# Patient Record
Sex: Female | Born: 1950 | Race: White | Hispanic: No | Marital: Married | Smoking: Never smoker
Health system: Southern US, Community
[De-identification: ages and names within clinical notes are randomized; demographics above are authoritative.]

## PROBLEM LIST (undated history)

## (undated) DIAGNOSIS — S99912A Unspecified injury of left ankle, initial encounter: Secondary | ICD-10-CM

## (undated) DIAGNOSIS — C801 Malignant (primary) neoplasm, unspecified: Secondary | ICD-10-CM

## (undated) DIAGNOSIS — M1990 Unspecified osteoarthritis, unspecified site: Secondary | ICD-10-CM

## (undated) DIAGNOSIS — K759 Inflammatory liver disease, unspecified: Secondary | ICD-10-CM

## (undated) DIAGNOSIS — F419 Anxiety disorder, unspecified: Secondary | ICD-10-CM

## (undated) DIAGNOSIS — Z8489 Family history of other specified conditions: Secondary | ICD-10-CM

## (undated) DIAGNOSIS — E785 Hyperlipidemia, unspecified: Secondary | ICD-10-CM

## (undated) HISTORY — PX: GANGLION CYST EXCISION: SHX1691

## (undated) HISTORY — DX: Hyperlipidemia, unspecified: E78.5

## (undated) HISTORY — PX: COLONOSCOPY: SHX174

## (undated) HISTORY — PX: REPLACEMENT TOTAL KNEE: SUR1224

## (undated) HISTORY — DX: Unspecified osteoarthritis, unspecified site: M19.90

## (undated) HISTORY — DX: Unspecified injury of left ankle, initial encounter: S99.912A

## (undated) HISTORY — PX: APPENDECTOMY: SHX54

## (undated) HISTORY — DX: Malignant (primary) neoplasm, unspecified: C80.1

## (undated) HISTORY — DX: Anxiety disorder, unspecified: F41.9

---

## 1998-08-31 ENCOUNTER — Other Ambulatory Visit: Admission: RE | Admit: 1998-08-31 | Discharge: 1998-08-31 | Payer: Self-pay | Admitting: Obstetrics and Gynecology

## 1999-07-25 HISTORY — PX: KNEE SURGERY: SHX244

## 1999-09-29 ENCOUNTER — Other Ambulatory Visit: Admission: RE | Admit: 1999-09-29 | Discharge: 1999-09-29 | Payer: Self-pay | Admitting: *Deleted

## 2000-07-03 ENCOUNTER — Encounter: Payer: Self-pay | Admitting: Obstetrics and Gynecology

## 2000-07-03 ENCOUNTER — Encounter: Admission: RE | Admit: 2000-07-03 | Discharge: 2000-07-03 | Payer: Self-pay | Admitting: Obstetrics and Gynecology

## 2000-10-25 ENCOUNTER — Other Ambulatory Visit: Admission: RE | Admit: 2000-10-25 | Discharge: 2000-10-25 | Payer: Self-pay | Admitting: Obstetrics and Gynecology

## 2001-10-03 ENCOUNTER — Encounter: Payer: Self-pay | Admitting: Obstetrics and Gynecology

## 2001-10-03 ENCOUNTER — Encounter: Admission: RE | Admit: 2001-10-03 | Discharge: 2001-10-03 | Payer: Self-pay | Admitting: Obstetrics and Gynecology

## 2001-11-15 ENCOUNTER — Other Ambulatory Visit: Admission: RE | Admit: 2001-11-15 | Discharge: 2001-11-15 | Payer: Self-pay | Admitting: Obstetrics and Gynecology

## 2002-11-21 ENCOUNTER — Other Ambulatory Visit: Admission: RE | Admit: 2002-11-21 | Discharge: 2002-11-21 | Payer: Self-pay | Admitting: Obstetrics and Gynecology

## 2002-11-26 ENCOUNTER — Encounter: Payer: Self-pay | Admitting: Obstetrics and Gynecology

## 2002-11-26 ENCOUNTER — Ambulatory Visit (HOSPITAL_COMMUNITY): Admission: RE | Admit: 2002-11-26 | Discharge: 2002-11-26 | Payer: Self-pay | Admitting: Obstetrics and Gynecology

## 2004-01-27 ENCOUNTER — Ambulatory Visit (HOSPITAL_COMMUNITY): Admission: RE | Admit: 2004-01-27 | Discharge: 2004-01-27 | Payer: Self-pay | Admitting: Obstetrics and Gynecology

## 2005-03-20 ENCOUNTER — Encounter: Admission: RE | Admit: 2005-03-20 | Discharge: 2005-03-20 | Payer: Self-pay | Admitting: Obstetrics and Gynecology

## 2005-03-23 ENCOUNTER — Other Ambulatory Visit: Admission: RE | Admit: 2005-03-23 | Discharge: 2005-03-23 | Payer: Self-pay | Admitting: Obstetrics and Gynecology

## 2006-02-23 ENCOUNTER — Ambulatory Visit: Payer: Self-pay | Admitting: Internal Medicine

## 2006-03-09 ENCOUNTER — Ambulatory Visit: Payer: Self-pay | Admitting: Internal Medicine

## 2006-03-30 ENCOUNTER — Other Ambulatory Visit: Admission: RE | Admit: 2006-03-30 | Discharge: 2006-03-30 | Payer: Self-pay | Admitting: Obstetrics and Gynecology

## 2006-04-17 ENCOUNTER — Encounter: Admission: RE | Admit: 2006-04-17 | Discharge: 2006-04-17 | Payer: Self-pay | Admitting: Obstetrics and Gynecology

## 2007-05-07 ENCOUNTER — Ambulatory Visit (HOSPITAL_COMMUNITY): Admission: RE | Admit: 2007-05-07 | Discharge: 2007-05-07 | Payer: Self-pay | Admitting: Obstetrics and Gynecology

## 2008-08-14 ENCOUNTER — Ambulatory Visit (HOSPITAL_COMMUNITY): Admission: RE | Admit: 2008-08-14 | Discharge: 2008-08-14 | Payer: Self-pay | Admitting: Obstetrics and Gynecology

## 2008-10-02 ENCOUNTER — Encounter: Admission: RE | Admit: 2008-10-02 | Discharge: 2008-10-02 | Payer: Self-pay

## 2009-09-22 ENCOUNTER — Ambulatory Visit (HOSPITAL_COMMUNITY): Admission: RE | Admit: 2009-09-22 | Discharge: 2009-09-22 | Payer: Self-pay | Admitting: Obstetrics and Gynecology

## 2010-08-14 ENCOUNTER — Encounter: Payer: Self-pay | Admitting: Obstetrics and Gynecology

## 2010-08-25 ENCOUNTER — Other Ambulatory Visit: Payer: Self-pay | Admitting: Obstetrics and Gynecology

## 2010-08-25 DIAGNOSIS — Z1239 Encounter for other screening for malignant neoplasm of breast: Secondary | ICD-10-CM

## 2010-09-26 ENCOUNTER — Ambulatory Visit
Admission: RE | Admit: 2010-09-26 | Discharge: 2010-09-26 | Disposition: A | Discharge status: Home / Self Care | Payer: BC Managed Care – PPO | Source: Ambulatory Visit | Attending: Obstetrics and Gynecology | Admitting: Obstetrics and Gynecology

## 2010-09-26 DIAGNOSIS — Z1239 Encounter for other screening for malignant neoplasm of breast: Secondary | ICD-10-CM

## 2011-01-22 LAB — HM PAP SMEAR: HM PAP: NORMAL

## 2011-04-03 ENCOUNTER — Encounter: Payer: Self-pay | Admitting: Internal Medicine

## 2011-09-20 ENCOUNTER — Encounter: Payer: Self-pay | Admitting: Internal Medicine

## 2011-09-20 ENCOUNTER — Other Ambulatory Visit: Payer: Self-pay | Admitting: Obstetrics and Gynecology

## 2011-09-20 DIAGNOSIS — Z1231 Encounter for screening mammogram for malignant neoplasm of breast: Secondary | ICD-10-CM

## 2011-10-13 ENCOUNTER — Ambulatory Visit (HOSPITAL_COMMUNITY)
Admission: RE | Admit: 2011-10-13 | Discharge: 2011-10-13 | Disposition: A | Discharge status: Home / Self Care | Payer: BC Managed Care – PPO | Source: Ambulatory Visit | Attending: Obstetrics and Gynecology | Admitting: Obstetrics and Gynecology

## 2011-10-13 DIAGNOSIS — Z1231 Encounter for screening mammogram for malignant neoplasm of breast: Secondary | ICD-10-CM | POA: Insufficient documentation

## 2011-10-19 ENCOUNTER — Encounter: Payer: Self-pay | Admitting: Internal Medicine

## 2011-10-19 ENCOUNTER — Ambulatory Visit (AMBULATORY_SURGERY_CENTER): Payer: BC Managed Care – PPO | Admitting: *Deleted

## 2011-10-19 VITALS — Ht 63.0 in | Wt 225.3 lb

## 2011-10-19 DIAGNOSIS — Z1211 Encounter for screening for malignant neoplasm of colon: Secondary | ICD-10-CM

## 2011-10-19 MED ORDER — PEG-KCL-NACL-NASULF-NA ASC-C 100 G PO SOLR: ORAL | Status: DC

## 2011-11-03 ENCOUNTER — Encounter: Payer: Self-pay | Admitting: Internal Medicine

## 2011-11-03 ENCOUNTER — Ambulatory Visit (AMBULATORY_SURGERY_CENTER): Payer: BC Managed Care – PPO | Admitting: Internal Medicine

## 2011-11-03 VITALS — BP 156/85 | HR 76 | Temp 98.2°F | Resp 17 | Ht 63.0 in | Wt 225.0 lb

## 2011-11-03 DIAGNOSIS — Z1211 Encounter for screening for malignant neoplasm of colon: Secondary | ICD-10-CM

## 2011-11-03 DIAGNOSIS — Z800 Family history of malignant neoplasm of digestive organs: Secondary | ICD-10-CM

## 2011-11-03 MED ORDER — SODIUM CHLORIDE 0.9 % IV SOLN: 500.0000 mL | INTRAVENOUS | Status: DC

## 2011-11-03 NOTE — Patient Instructions (Signed)

## 2011-11-03 NOTE — Op Note (Signed)
[facility] [address] [place], [place]  [address]  COLONOSCOPY PROCEDURE REPORT  PATIENT:  Deanna, Allen  MR#:  [id number] BIRTHDATE:  1951/05/10, 60 yrs. old  GENDER:  female ENDOSCOPIST:  Hedwig Morton. Juanda Chance, MD REF. BY:  Silverio Lay, M.D. PROCEDURE DATE:  11/03/2011 PROCEDURE:  Colonoscopy 96295 ASA CLASS:  Class I INDICATIONS:  family history of colon cancer parent and grandparent with colon cancer, prior exam 2002,2007 MEDICATIONS:   MAC sedation, administered by CRNA, propofol (Diprivan) 300 mg  DESCRIPTION OF PROCEDURE:   After the risks and benefits and of the procedure were explained, informed consent was obtained. Digital rectal exam was performed and revealed no rectal masses. The LB [id number] endoscope was introduced through the anus and advanced to the cecum, which was identified by both the appendix and ileocecal valve.  The quality of the prep was good, using MoviPrep.  The instrument was then slowly withdrawn as the colon was fully examined. <<PROCEDUREIMAGES>>  FINDINGS:  No polyps or cancers were seen (see image1, image2, and image3).  Internal Hemorrhoids were found (see image5 and image4). Retroflexed views in the rectum revealed no abnormalities.    The scope was then withdrawn from the patient and the procedure completed.  COMPLICATIONS:  None ENDOSCOPIC IMPRESSION: 1) No polyps or cancers 2) Internal hemorrhoids 3) Normal colonoscopy RECOMMENDATIONS: 1) High fiber diet.  REPEAT EXAM:  In 5 year(s) for.  ______________________________ Hedwig Morton. Juanda Chance, MD  CC:  n. eSIGNED:   Hedwig Morton. Dempsey Ahonen at 11/03/2011 11:05 AM  Mylinda Latina, [phone]

## 2011-11-03 NOTE — Progress Notes (Signed)
Patient did not have preoperative order for IV antibiotic SSI prophylaxis. (G8918)  Patient did not experience any of the following events: a burn prior to discharge; a fall within the facility; wrong site/side/patient/procedure/implant event; or a hospital transfer or hospital admission upon discharge from the facility. (G8907)  

## 2011-11-06 ENCOUNTER — Telehealth: Payer: Self-pay | Admitting: *Deleted

## 2011-11-06 NOTE — Telephone Encounter (Signed)
  Follow up Call-  Call back number 11/03/2011  Post procedure Call Back phone  # [phone]  Permission to leave phone message Yes    Left message that we called to follow up after procedure done on fri.

## 2012-04-16 ENCOUNTER — Other Ambulatory Visit: Payer: Self-pay | Admitting: Family Medicine

## 2012-04-16 NOTE — Telephone Encounter (Signed)
Need paper chart  

## 2012-04-25 ENCOUNTER — Encounter: Payer: Self-pay | Admitting: Family Medicine

## 2012-04-25 ENCOUNTER — Ambulatory Visit (INDEPENDENT_AMBULATORY_CARE_PROVIDER_SITE_OTHER): Payer: BC Managed Care – PPO | Admitting: Family Medicine

## 2012-04-25 VITALS — BP 156/89 | HR 86 | Temp 98.5°F | Resp 16 | Ht 62.0 in | Wt 227.8 lb

## 2012-04-25 DIAGNOSIS — G4700 Insomnia, unspecified: Secondary | ICD-10-CM

## 2012-04-25 DIAGNOSIS — H6120 Impacted cerumen, unspecified ear: Secondary | ICD-10-CM

## 2012-04-25 DIAGNOSIS — F419 Anxiety disorder, unspecified: Secondary | ICD-10-CM | POA: Insufficient documentation

## 2012-04-25 DIAGNOSIS — R030 Elevated blood-pressure reading, without diagnosis of hypertension: Secondary | ICD-10-CM

## 2012-04-25 DIAGNOSIS — E785 Hyperlipidemia, unspecified: Secondary | ICD-10-CM

## 2012-04-25 DIAGNOSIS — Z23 Encounter for immunization: Secondary | ICD-10-CM

## 2012-04-25 DIAGNOSIS — Z0000 Encounter for general adult medical examination without abnormal findings: Secondary | ICD-10-CM

## 2012-04-25 LAB — POCT URINALYSIS DIPSTICK
Glucose, UA: NEGATIVE
Nitrite, UA: NEGATIVE
Protein, UA: NEGATIVE
Spec Grav, UA: 1.025
Urobilinogen, UA: 0.2

## 2012-04-25 MED ORDER — PRAVASTATIN SODIUM 20 MG PO TABS: 20.0000 mg | ORAL_TABLET | Freq: Every day | ORAL | Status: DC

## 2012-04-25 MED ORDER — LORAZEPAM 1 MG PO TABS: 1.0000 mg | ORAL_TABLET | Freq: Every day | ORAL | Status: DC

## 2012-04-25 NOTE — Progress Notes (Signed)
Subjective:    Patient ID: Deanna Allen, female    DOB: January 10, 1951, 61 y.o.   MRN: [id number]  HPI  This 61 y.o. Cauc female is here for CPE/PAP; she had normal PAP in June 2012 but is  concerned because one of her good friends was recently diagnosed with cervical cancer after having   "normal" PAPs. After further discussion, pt decides not to have PAP today.   Chronic issues with Insomnia- virtually no sleep while rasing son because he suffered with  "night terrors" and would not sleep more than a few hours; now she cannot sleep through the night  without Lorazepam.    Last  MMG: March 2013 (normal)  Last Colonoscopy; July 2013 (normal)    Review of Systems  Constitutional: Negative.   HENT: Positive for sneezing.        Chronic problem with excessive ear wax- needs ears flushed out  Eyes: Positive for redness and itching.  Respiratory: Negative.   Cardiovascular: Negative.   Gastrointestinal: Negative.   Musculoskeletal: Positive for arthralgias.  Skin: Negative.   Neurological: Negative.   Hematological: Negative.   Psychiatric/Behavioral: Positive for disturbed wake/sleep cycle. The patient is nervous/anxious.        Hx of panic attacks x 17 years; finally off Zoloft in 2012       Objective:   Physical Exam  Nursing note and vitals reviewed. Constitutional: She is oriented to person, place, and time. She appears well-developed and well-nourished. No distress.  HENT:  Head: Normocephalic and atraumatic.  Right Ear: Hearing and external ear normal.  Left Ear: Hearing and external ear normal.  Nose: Nose normal. No mucosal edema, nasal deformity or septal deviation.  Mouth/Throat: Uvula is midline, oropharynx is clear and moist and mucous membranes are normal. No oral lesions. Normal dentition. No dental caries.       EACs occluded by cerumen  Eyes: Conjunctivae normal, EOM and lids are normal. Pupils are equal, round, and reactive to light. No scleral icterus.    Fundoscopic exam:      The right eye shows arteriolar narrowing. The right eye shows no exudate and no hemorrhage. The right eye shows red reflex.      The left eye shows arteriolar narrowing. The left eye shows no exudate and no hemorrhage. The left eye shows red reflex. Neck: Normal range of motion. Neck supple. No JVD present. No thyromegaly present.  Cardiovascular: Normal rate, regular rhythm, normal heart sounds and intact distal pulses.  Exam reveals no gallop and no friction rub.   No murmur heard. Pulmonary/Chest: Effort normal and breath sounds normal. No respiratory distress. She has no wheezes. She exhibits no tenderness. Right breast exhibits no inverted nipple, no mass, no nipple discharge, no skin change and no tenderness. Left breast exhibits no inverted nipple, no mass, no nipple discharge, no skin change and no tenderness. Breasts are symmetrical.  Abdominal: Soft. Bowel sounds are normal. She exhibits no distension, no abdominal bruit, no pulsatile midline mass and no mass. There is no hepatosplenomegaly. There is no tenderness. There is no guarding and no CVA tenderness.  Genitourinary:       GYN exam deferred  Musculoskeletal: Normal range of motion. She exhibits no edema and no tenderness.  Lymphadenopathy:    She has no cervical adenopathy.  Neurological: She is alert and oriented to person, place, and time. She has normal reflexes. No cranial nerve deficit. She exhibits normal muscle tone. Coordination normal.  Skin: Skin is warm and dry.  No erythema. No pallor.  Psychiatric: She has a normal mood and affect. Her behavior is normal. Judgment and thought content normal.    ECG: NRS, no ectopy or ST-TW changes      Assessment & Plan:   1. Routine general medical examination at a health care facility  POCT urinalysis dipstick, EKG 12-Lead  2. Insomnia  RF: Lorazepam  3. Hyperlipidemia  Comprehensive metabolic panel, Lipid panel  4. Cerumen impaction  Bilateral ear  irrigation completed  5. Need for influenza vaccination  Flu vaccine greater than or equal to 3yo preservative free IM  6. Obesity, Class III, BMI 40-49.9 (morbid obesity)  Encouraged better nutrition and physical activity  7. Elevated blood pressure reading  Advised monitoring; expect reduction with weight loss

## 2012-04-25 NOTE — Patient Instructions (Addendum)
Keeping You Healthy  Get These Tests  Blood Pressure- Have your blood pressure checked by your healthcare provider at least once a year.  Normal blood pressure is 120/80.  Weight- Have your body mass index (BMI) calculated to screen for obesity.  BMI is a measure of body fat based on height and weight.  You can calculate your own BMI at https://www.west-esparza.com/  Cholesterol- Have your cholesterol checked every year.  Diabetes- Have your blood sugar checked every year if you have high blood pressure, high cholesterol, a family history of diabetes or if you are overweight.  Pap Smear- Have a pap smear every 1 to 3 years if you have been sexually active.  If you are older than 65 and recent pap smears have been normal you may not need additional pap smears.  In addition, if you have had a hysterectomy  For benign disease additional pap smears are not necessary.  Mammogram-Yearly mammograms are essential for early detection of breast cancer  Screening for Colon Cancer- Colonoscopy starting at age 26. Screening may begin sooner depending on your family history and other health conditions.  Follow up colonoscopy as directed by your Gastroenterologist.  Screening for Osteoporosis- Screening begins at age 61 with bone density scanning, sooner if you are at higher risk for developing Osteoporosis.  Get these medicines  Calcium with Vitamin D- Your body requires 1200-1500 mg of Calcium a day and [phone] IU of Vitamin D a day.  You can only absorb 500 mg of Calcium at a time therefore Calcium must be taken in 2 or 3 separate doses throughout the day.  Hormones- Hormone therapy has been associated with increased risk for certain cancers and heart disease.  Talk to your healthcare provider about if you need relief from menopausal symptoms.  Aspirin- Ask your healthcare provider about taking Aspirin to prevent Heart Disease and Stroke.  Get these Immuniztions  Flu shot- Every fall. You received  your Flu vaccine today.  Pneumonia shot- Once after the age of 80; if you are younger ask your healthcare provider if you need a pneumonia shot.  Tetanus- Every ten years. Your last Tetanus (Tdap) was given in 2012. Next dose due 201022; this will be a regular Tetanus.  Zostavax- Once after the age of 53 to prevent shingles. You have been given a prescription for this vaccine; it is available at [employer] and [employer] at [facility].  Take these steps  Don't smoke- Your healthcare provider can help you quit. For tips on how to quit, ask your healthcare provider or go to www.smokefree.gov or call 1-800 QUIT-NOW.  Be physically active- Exercise 5 days a week for a minimum of 30 minutes.  If you are not already physically active, start slow and gradually work up to 30 minutes of moderate physical activity.  Try walking, dancing, bike riding, swimming, etc.  Eat a healthy diet- Eat a variety of healthy foods such as fruits, vegetables, whole grains, low fat milk, low fat cheeses, yogurt, lean meats, chicken, fish, eggs, dried beans, tofu, etc.  For more information go to www.thenutritionsource.org  Dental visit- Brush and floss teeth twice daily; visit your dentist twice a year.  Eye exam- Visit your Optometrist or Ophthalmologist yearly.  Drink alcohol in moderation- Limit alcohol intake to one drink or less a day.  Never drink and drive.  Depression- Your emotional health is as important as your physical health.  If you're feeling down or losing interest in things you normally enjoy,  please talk to your healthcare provider.  Seat Belts- can save your life; always wear one  Smoke/Carbon Monoxide detectors- These detectors need to be installed on the appropriate level of your home.  Replace batteries at least once a year.  Violence- If anyone is threatening or hurting you, please tell your healthcare provider.  Living Will/ Health care power of attorney- Discuss with your  healthcare provider and family.    Hypertriglyceridemia  Diet for High blood levels of Triglycerides Most fats in food are triglycerides. Triglycerides in your blood are stored as fat in your body. High levels of triglycerides in your blood may put you at a greater risk for heart disease and stroke.  Normal triglyceride levels are less than 150 mg/dL. Borderline high levels are 150-199 mg/dl. High levels are 200 - 499 mg/dL, and very high triglyceride levels are greater than 500 mg/dL. The decision to treat high triglycerides is generally based on the level. For people with borderline or high triglyceride levels, treatment includes weight loss and exercise. Drugs are recommended for people with very high triglyceride levels. Many people who need treatment for high triglyceride levels have metabolic syndrome. This syndrome is a collection of disorders that often include: insulin resistance, high blood pressure, blood clotting problems, high cholesterol and triglycerides. TESTING PROCEDURE FOR TRIGLYCERIDES  You should not eat 4 hours before getting your triglycerides measured. The normal range of triglycerides is between 10 and 250 milligrams per deciliter (mg/dl). Some people may have extreme levels (1000 or above), but your triglyceride level may be too high if it is above 150 mg/dl, depending on what other risk factors you have for heart disease.  People with high blood triglycerides may also have high blood cholesterol levels. If you have high blood cholesterol as well as high blood triglycerides, your risk for heart disease is probably greater than if you only had high triglycerides. High blood cholesterol is one of the main risk factors for heart disease. CHANGING YOUR DIET  Your weight can affect your blood triglyceride level. If you are more than 20% above your ideal body weight, you may be able to lower your blood triglycerides by losing weight. Eating less and exercising regularly is the best  way to combat this. Fat provides more calories than any other food. The best way to lose weight is to eat less fat. Only 30% of your total calories should come from fat. Less than 7% of your diet should come from saturated fat. A diet low in fat and saturated fat is the same as a diet to decrease blood cholesterol. By eating a diet lower in fat, you may lose weight, lower your blood cholesterol, and lower your blood triglyceride level.  Eating a diet low in fat, especially saturated fat, may also help you lower your blood triglyceride level. Ask your dietitian to help you figure how much fat you can eat based on the number of calories your caregiver has prescribed for you.  Exercise, in addition to helping with weight loss may also help lower triglyceride levels.   Alcohol can increase blood triglycerides. You may need to stop drinking alcoholic beverages.  Too much carbohydrate in your diet may also increase your blood triglycerides. Some complex carbohydrates are necessary in your diet. These may include bread, rice, potatoes, other starchy vegetables and cereals.  Reduce "simple" carbohydrates. These may include pure sugars, candy, honey, and jelly without losing other nutrients. If you have the kind of high blood triglycerides that is affected  by the amount of carbohydrates in your diet, you will need to eat less sugar and less high-sugar foods. Your caregiver can help you with this.  Adding 2-4 grams of fish oil (EPA+ DHA) may also help lower triglycerides. Speak with your caregiver before adding any supplements to your regimen. Following the Diet  Maintain your ideal weight. Your caregivers can help you with a diet. Generally, eating less food and getting more exercise will help you lose weight. Joining a weight control group may also help. Ask your caregivers for a good weight control group in your area.  Eat low-fat foods instead of high-fat foods. This can help you lose weight too.  These  foods are lower in fat. Eat MORE of these:   Dried beans, peas, and lentils.  Egg whites.  Low-fat cottage cheese.  Fish.  Lean cuts of meat, such as round, sirloin, rump, and flank (cut extra fat off meat you fix).  Whole grain breads, cereals and pasta.  Skim and nonfat dry milk.  Low-fat yogurt.  Poultry without the skin.  Cheese made with skim or part-skim milk, such as mozzarella, parmesan, farmers', ricotta, or pot cheese. These are higher fat foods. Eat LESS of these:   Whole milk and foods made from whole milk, such as American, blue, cheddar, monterey jack, and swiss cheese  High-fat meats, such as luncheon meats, sausages, knockwurst, bratwurst, hot dogs, ribs, corned beef, ground pork, and regular ground beef.  Fried foods. Limit saturated fats in your diet. Substituting unsaturated fat for saturated fat may decrease your blood triglyceride level. You will need to read package labels to know which products contain saturated fats.  These foods are high in saturated fat. Eat LESS of these:   Fried pork skins.  Whole milk.  Skin and fat from poultry.  Palm oil.  Butter.  Shortening.  Cream cheese.  Tomasa Blase.  Margarines and baked goods made from listed oils.  Vegetable shortenings.  Chitterlings.  Fat from meats.  Coconut oil.  Palm kernel oil.  Lard.  Cream.  Sour cream.  Fatback.  Coffee whiteners and non-dairy creamers made with these oils.  Cheese made from whole milk. Use unsaturated fats (both polyunsaturated and monounsaturated) moderately. Remember, even though unsaturated fats are better than saturated fats; you still want a diet low in total fat.  These foods are high in unsaturated fat:   Canola oil.  Sunflower oil.  Mayonnaise.  Almonds.  Peanuts.  Pine nuts.  Margarines made with these oils.  Safflower oil.  Olive oil.  Avocados.  Cashews.  Peanut butter.  Sunflower seeds.  Soybean oil.  Peanut  oil.  Olives.  Pecans.  Walnuts.  Pumpkin seeds. Avoid sugar and other high-sugar foods. This will decrease carbohydrates without decreasing other nutrients. Sugar in your food goes rapidly to your blood. When there is excess sugar in your blood, your liver may use it to make more triglycerides. Sugar also contains calories without other important nutrients.  Eat LESS of these:   Sugar, brown sugar, powdered sugar, jam, jelly, preserves, honey, syrup, molasses, pies, candy, cakes, cookies, frosting, pastries, colas, soft drinks, punches, fruit drinks, and regular gelatin.  Avoid alcohol. Alcohol, even more than sugar, may increase blood triglycerides. In addition, alcohol is high in calories and low in nutrients. Ask for sparkling water, or a diet soft drink instead of an alcoholic beverage. Suggestions for planning and preparing meals   Bake, broil, grill or roast meats instead of frying.  Remove fat  from meats and skin from poultry before cooking.  Add spices, herbs, lemon juice or vinegar to vegetables instead of salt, rich sauces or gravies.  Use a non-stick skillet without fat or use no-stick sprays.  Cool and refrigerate stews and broth. Then remove the hardened fat floating on the surface before serving.  Refrigerate meat drippings and skim off fat to make low-fat gravies.  Serve more fish.  Use less butter, margarine and other high-fat spreads on bread or vegetables.  Use skim or reconstituted non-fat dry milk for cooking.  Cook with low-fat cheeses.  Substitute low-fat yogurt or cottage cheese for all or part of the sour cream in recipes for sauces, dips or congealed salads.  Use half yogurt/half mayonnaise in salad recipes.  Substitute evaporated skim milk for cream. Evaporated skim milk or reconstituted non-fat dry milk can be whipped and substituted for whipped cream in certain recipes.  Choose fresh fruits for dessert instead of high-fat foods such as pies or  cakes. Fruits are naturally low in fat. When Dining Out   Order low-fat appetizers such as fruit or vegetable juice, pasta with vegetables or tomato sauce.  Select clear, rather than cream soups.  Ask that dressings and gravies be served on the side. Then use less of them.  Order foods that are baked, broiled, poached, steamed, stir-fried, or roasted.  Ask for margarine instead of butter, and use only a small amount.  Drink sparkling water, unsweetened tea or coffee, or diet soft drinks instead of alcohol or other sweet beverages. QUESTIONS AND ANSWERS ABOUT OTHER FATS IN THE BLOOD: SATURATED FAT, TRANS FAT, AND CHOLESTEROL What is trans fat? Trans fat is a type of fat that is formed when vegetable oil is hardened through a process called hydrogenation. This process helps makes foods more solid, gives them shape, and prolongs their shelf life. Trans fats are also called hydrogenated or partially hydrogenated oils.  What do saturated fat, trans fat, and cholesterol in foods have to do with heart disease? Saturated fat, trans fat, and cholesterol in the diet all raise the level of LDL "bad" cholesterol in the blood. The higher the LDL cholesterol, the greater the risk for coronary heart disease (CHD). Saturated fat and trans fat raise LDL similarly.  What foods contain saturated fat, trans fat, and cholesterol? High amounts of saturated fat are found in animal products, such as fatty cuts of meat, chicken skin, and full-fat dairy products like butter, whole milk, cream, and cheese, and in tropical vegetable oils such as palm, palm kernel, and coconut oil. Trans fat is found in some of the same foods as saturated fat, such as vegetable shortening, some margarines (especially hard or stick margarine), crackers, cookies, baked goods, fried foods, salad dressings, and other processed foods made with partially hydrogenated vegetable oils. Small amounts of trans fat also occur naturally in some animal  products, such as milk products, beef, and lamb. Foods high in cholesterol include liver, other organ meats, egg yolks, shrimp, and full-fat dairy products. How can I use the new food label to make heart-healthy food choices? Check the Nutrition Facts panel of the food label. Choose foods lower in saturated fat, trans fat, and cholesterol. For saturated fat and cholesterol, you can also use the Percent Daily Value (%DV): 5% DV or less is low, and 20% DV or more is high. (There is no %DV for trans fat.) Use the Nutrition Facts panel to choose foods low in saturated fat and cholesterol, and if the  trans fat is not listed, read the ingredients and limit products that list shortening or hydrogenated or partially hydrogenated vegetable oil, which tend to be high in trans fat. POINTS TO REMEMBER:   Discuss your risk for heart disease with your caregivers, and take steps to reduce risk factors.  Change your diet. Choose foods that are low in saturated fat, trans fat, and cholesterol.  Add exercise to your daily routine if it is not already being done. Participate in physical activity of moderate intensity, like brisk walking, for at least 30 minutes on most, and preferably all days of the week. No time? Break the 30 minutes into three, 10-minute segments during the day.  Stop smoking. If you do smoke, contact your caregiver to discuss ways in which they can help you quit.  Do not use street drugs.  Maintain a normal weight.  Maintain a healthy blood pressure.  Keep up with your blood work for checking the fats in your blood as directed by your caregiver. Document Released: 04/27/2004 Document Revised: 01/09/2012 Document Reviewed: 11/23/2008 [facility] Patient Information 2013 [place], [place].    Shingles Shingles is caused by the same virus that causes chickenpox (varicella zoster virus or VZV). Shingles often occurs many years or decades after having chickenpox. That is why it is more common in  adults older than 50 years. The virus reactivates and breaks out as an infection in a nerve root. SYMPTOMS   The initial feeling (sensations) may be pain. This pain is usually described as:  Burning.  Stabbing.  Throbbing.  Tingling in the nerve root.  A red rash will follow in a couple days. The rash may occur in any area of the body and is usually on one side (unilateral) of the body in a band or belt-like pattern. The rash usually starts out as very small blisters (vesicles). They will dry up after 7 to 10 days. This is not usually a significant problem except for the pain it causes.  Long-lasting (chronic) pain is more likely in an elderly person. It can last months to years. This condition is called postherpetic neuralgia. Shingles can be an extremely severe infection in someone with AIDS, a weakened immune system, or with forms of leukemia. It can also be severe if you are taking transplant medicines or other medicines that weaken the immune system. TREATMENT  Your caregiver will often treat you with:  Antiviral drugs.  Anti-inflammatory drugs.  Pain medicines. Bed rest is very important in preventing the pain associated with herpes zoster (postherpetic neuralgia). Application of heat in the form of a hot water bottle or electric heating pad or gentle pressure with the hand is recommended to help with the pain or discomfort. PREVENTION  A varicella zoster vaccine is available to help protect against the virus. The Food and Drug Administration approved the varicella zoster vaccine for individuals 70 years of age and older. HOME CARE INSTRUCTIONS   Cool compresses to the area of rash may be helpful.  Only take over-the-counter or prescription medicines for pain, discomfort, or fever as directed by your caregiver.  Avoid contact with:  Babies.  Pregnant women.  Children with eczema.  Elderly people with transplants.  People with chronic illnesses, such as leukemia and  AIDS.  If the area involved is on your face, you may receive a referral for follow-up to a specialist. It is very important to keep all follow-up appointments. This will help avoid eye complications, chronic pain, or disability. SEEK IMMEDIATE MEDICAL CARE  IF:   You develop any pain (headache) in the area of the face or eye. This must be followed carefully by your caregiver or ophthalmologist. An infection in part of your eye (cornea) can be very serious. It could lead to blindness.  You do not have pain relief from prescribed medicines.  Your redness or swelling spreads.  The area involved becomes very swollen and painful.  You have a fever.  You notice any red or painful lines extending away from the affected area toward your heart (lymphangitis).  Your condition is worsening or has changed. Document Released: 07/10/2005 Document Revised: 10/02/2011 Document Reviewed: 06/14/2009 [facility] Patient Information 2013 [place], [place].

## 2012-04-26 LAB — COMPREHENSIVE METABOLIC PANEL
AST: 22 U/L (ref 0–37)
Albumin: 4.3 g/dL (ref 3.5–5.2)
Alkaline Phosphatase: 108 U/L (ref 39–117)
BUN: 19 mg/dL (ref 6–23)
Glucose, Bld: 89 mg/dL (ref 70–99)
Potassium: 4.2 mEq/L (ref 3.5–5.3)
Sodium: 140 mEq/L (ref 135–145)
Total Bilirubin: 0.8 mg/dL (ref 0.3–1.2)
Total Protein: 7.2 g/dL (ref 6.0–8.3)

## 2012-04-26 LAB — LIPID PANEL
HDL: 46 mg/dL (ref >39)
LDL Cholesterol: 98 mg/dL (ref 0–99)
Triglycerides: 135 mg/dL (ref <150)
VLDL: 27 mg/dL (ref 0–40)

## 2012-04-26 NOTE — Progress Notes (Signed)
Quick Note:  Please notify pt that results are normal.   Provide pt with copy of labs. ______ 

## 2012-05-22 ENCOUNTER — Encounter: Payer: Self-pay | Admitting: Family Medicine

## 2012-09-13 ENCOUNTER — Ambulatory Visit (INDEPENDENT_AMBULATORY_CARE_PROVIDER_SITE_OTHER): Payer: PRIVATE HEALTH INSURANCE | Admitting: Family Medicine

## 2012-09-13 ENCOUNTER — Encounter: Payer: Self-pay | Admitting: Family Medicine

## 2012-09-13 VITALS — BP 152/86 | HR 97 | Temp 98.3°F | Resp 16 | Ht 62.0 in | Wt 214.0 lb

## 2012-09-13 DIAGNOSIS — F411 Generalized anxiety disorder: Secondary | ICD-10-CM

## 2012-09-13 DIAGNOSIS — G4700 Insomnia, unspecified: Secondary | ICD-10-CM

## 2012-09-13 DIAGNOSIS — F5104 Psychophysiologic insomnia: Secondary | ICD-10-CM

## 2012-09-13 DIAGNOSIS — L719 Rosacea, unspecified: Secondary | ICD-10-CM

## 2012-09-13 MED ORDER — SERTRALINE HCL 50 MG PO TABS: ORAL_TABLET | ORAL | Status: DC

## 2012-09-13 MED ORDER — METRONIDAZOLE 0.75 % EX GEL: Freq: Two times a day (BID) | CUTANEOUS | Status: DC

## 2012-09-13 MED ORDER — LORAZEPAM 2 MG PO TABS: ORAL_TABLET | ORAL | Status: DC

## 2012-09-13 NOTE — Patient Instructions (Addendum)
Rosacea skin care- cleanse your facial area with Cetaphil; it is a gentle cleanser for sensitive skin available at your pharmacy or grocery store.  For anxiety- try drinking an herbal tea; there are some good ones available at [employer]. Lemon balm is also good for anxiety and it may be available in capsule form or in a tea (combined with other calming herbs).

## 2012-09-13 NOTE — Progress Notes (Signed)
S:  This 62 y.o. Cauc female has chronic anxiety, treated for 16 years w/ Zoloft. Pt weaned herself off this medication in Nov 2012. She is having increasing anxiety for the last 3 months and senses that she needs to restart the medication. She states that she "just never has handled changes well" and anxiety dates back to her childhood. Her husband has been diagnosed w/ Parkinson's Disease; he is still functioning and working. She admits she has a "wonderful life" with successful children and "beautiful grandchildren" and "really has no reason to feel the way" she does. They have a vacation home at [facility], [place]. but she "absolutely hates it" because it is in an isolated area; her husband purchased the property as a Glass blower/designer.  She has been exercising at the fitness center and feels better. 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 She has been told she needs TKR but she is putting this off as long as possible.  Chronic insomnia dates back to when her son had night terrors for 11 years; Lorazepam 1 mg helps pt get 4 hours of sleep at the most.   Facial rash x 2 months started along jaw line but now has spread to both cheeks and forehead. Pt had read about rosacea which is what she thinks she has. She cleanses with a gentle soap and water. No new perfumes, skin produucts, OTC supplements or exposure to allergens. Pt denies fever, other rash, HA, myalgias or arthralgias, n/v or other GI disturbance.  ROS: As per HPI.  O: Filed Vitals:   09/13/12 1412  BP: 152/86  Pulse: 97  Temp: 98.3 F (36.8 C)  Resp: 16   GEN: In NAD; WN,WD. Pt is obese. HEENT: [place]/AT; EOMI w/ clear conj/ sclerae. EACs/ TMs normal. Oroph clear and moist. NECK: Supple w/o LAN or TMG. COR: RRR. LUNGS: Normal resp rate and effort. SKIN: W&D except Facial area- intensely erythematous papules/macules over cheeks and forehead. No pustules or vesicles. MS: Knees- bilat deformities c/w DJD. Decreased  ROM. NEURO: A&O x 3; CNs intact. Mentation- anxious, very talkative; attentive w/ normal behavior; good judgement and coherent thoughts.  A/P: Generalized anxiety disorder- RX: Sertraline 50 mg 1/2 tablet ever AM. Consider counseling.  Chronic insomnia- RX: Lorazepam 2 mg tablet  Take 1 tablet hs for ~2 weeks while initiating Sertraline then decrease dose to 1/2 tab hs.  Acne rosacea- RX: Metrogel topical. Gentle skin cleansing routine. Rash may improve if anxiety is controlled.   Meds ordered this encounter  Medications  . sertraline (ZOLOFT) 50 MG tablet    Sig: Take 1/2 tablet every AM for anxiety or as directed.    Dispense:  30 tablet    Refill:  5  . LORazepam (ATIVAN) 2 MG tablet    Sig: Take 1/2 - 1 tablet hs for sleep.    Dispense:  30 tablet    Refill:  2  . metroNIDAZOLE (METROGEL) 0.75 % gel    Sig: Apply topically 2 (two) times daily.    Dispense:  45 g    Refill:  3

## 2012-10-01 ENCOUNTER — Other Ambulatory Visit: Payer: Self-pay | Admitting: Radiology

## 2012-10-01 MED ORDER — SERTRALINE HCL 50 MG PO TABS: ORAL_TABLET | ORAL | Status: DC

## 2012-10-01 NOTE — Telephone Encounter (Signed)
Refill encounter ?

## 2012-10-07 ENCOUNTER — Telehealth: Payer: Self-pay

## 2012-10-07 MED ORDER — SERTRALINE HCL 50 MG PO TABS: ORAL_TABLET | ORAL | Status: DC

## 2012-10-07 NOTE — Telephone Encounter (Signed)
Pt reported that ins prefers she get her Rxs through [facility] now. She last got her sertraline at [facility] in Feb. I will send in Rx to [facility] and pt stated she will make sure that [facility] cancels the one they got ready for her last week that she did not p/up.

## 2012-10-07 NOTE — Telephone Encounter (Signed)
LMOM on both #s to CB. We received a req for RF on pt's sertraline 50 mg from [facility]. We just sent in 1 yr of RFs to local pharmacy. Where does pt want this Rx?

## 2012-10-14 ENCOUNTER — Ambulatory Visit: Payer: PRIVATE HEALTH INSURANCE

## 2012-10-14 ENCOUNTER — Ambulatory Visit (INDEPENDENT_AMBULATORY_CARE_PROVIDER_SITE_OTHER): Payer: PRIVATE HEALTH INSURANCE | Admitting: Emergency Medicine

## 2012-10-14 VITALS — BP 120/78 | HR 91 | Temp 98.2°F | Resp 16 | Ht 62.0 in | Wt 212.0 lb

## 2012-10-14 DIAGNOSIS — M25579 Pain in unspecified ankle and joints of unspecified foot: Secondary | ICD-10-CM

## 2012-10-14 DIAGNOSIS — S92302A Fracture of unspecified metatarsal bone(s), left foot, initial encounter for closed fracture: Secondary | ICD-10-CM

## 2012-10-14 DIAGNOSIS — S82409A Unspecified fracture of shaft of unspecified fibula, initial encounter for closed fracture: Secondary | ICD-10-CM

## 2012-10-14 DIAGNOSIS — M25572 Pain in left ankle and joints of left foot: Secondary | ICD-10-CM

## 2012-10-14 DIAGNOSIS — S82402A Unspecified fracture of shaft of left fibula, initial encounter for closed fracture: Secondary | ICD-10-CM

## 2012-10-14 DIAGNOSIS — S92309A Fracture of unspecified metatarsal bone(s), unspecified foot, initial encounter for closed fracture: Secondary | ICD-10-CM

## 2012-10-14 NOTE — Progress Notes (Signed)
  Subjective:    Patient ID: Deanna Allen, female    DOB: 11/08/1950, 62 y.o.   MRN: [id number]  HPI Pt presents to clinic today with Lt foot and ankle pain. She states she was not aware that her Lt leg had fallen asleep when she got up to let her dog out. Because her leg was numb and tingly, she fell and had intense pain. This occurred yesterday evening.   Review of Systems     Objective:   Physical Exam there is tenderness and swelling over the left lateral malleolus. There is tenderness and swelling over the base of the fifth metatarsal. There is no tenderness over the deltoid ligament. There is normal sensation to the feet and normal coloration. [facility] reading (PRIMARY) by  Dr..Reubin Bushnell is a fracture of the base of the fifth metatarsal. There is also questionable fracture a bulging type of the distal lateral fibula that is best seen on the views of the foot.        Assessment & Plan:  Patient has suspected fracture of the distal fibula. There is a definite fracture of the fifth metatarsal. She will be treated with a cam boot and crutches recheck ear in 10 days repeat films at that time.

## 2012-10-23 ENCOUNTER — Ambulatory Visit: Payer: PRIVATE HEALTH INSURANCE

## 2012-10-23 ENCOUNTER — Ambulatory Visit (INDEPENDENT_AMBULATORY_CARE_PROVIDER_SITE_OTHER): Payer: PRIVATE HEALTH INSURANCE | Admitting: Family Medicine

## 2012-10-23 VITALS — BP 110/70 | HR 76 | Temp 98.5°F | Resp 12 | Ht 62.0 in | Wt 210.0 lb

## 2012-10-23 DIAGNOSIS — M25572 Pain in left ankle and joints of left foot: Secondary | ICD-10-CM

## 2012-10-23 DIAGNOSIS — M79672 Pain in left foot: Secondary | ICD-10-CM

## 2012-10-23 DIAGNOSIS — S8290XD Unspecified fracture of unspecified lower leg, subsequent encounter for closed fracture with routine healing: Secondary | ICD-10-CM

## 2012-10-23 DIAGNOSIS — S92302D Fracture of unspecified metatarsal bone(s), left foot, subsequent encounter for fracture with routine healing: Secondary | ICD-10-CM

## 2012-10-23 NOTE — Progress Notes (Signed)
Subjective:    Patient ID: Deanna Allen, female    DOB: 1950-10-23, 62 y.o.   MRN: [id number] Chief Complaint  Patient presents with  . Follow-up    HPI Here for recheck of her ankle and foot fracture 10d after initial diagnosis. Has been complying with non-weight bearing and not needing anything for pain. Wearing the CAM boot whenever she gets up at all. The swelling has gone down but bruising has gotten worse.  It is very painful over her ankle but minimally painful over her foot.  Pt has actually only been doing  touch down weightbearing - cannot balance on crutches if she tries to completely take her left foot off the ground when walking   Past Medical History  Diagnosis Date  . Arthritis   . Cancer     basal cell CA-face  . Hyperlipidemia   . Depression    Current Outpatient Prescriptions on File Prior to Visit  Medication Sig Dispense Refill  . aspirin 81 MG tablet Take 81 mg by mouth daily.      . Cholecalciferol (VITAMIN D PO) Take 1 tablet by mouth 2 (two) times daily.      Marland Kitchen GLUCOSAMINE-CHONDROITIN PO Take 1 tablet by mouth 2 (two) times daily.      Marland Kitchen LORazepam (ATIVAN) 2 MG tablet Take 1/2 - 1 tablet hs for sleep.  30 tablet  2  . Multiple Vitamins-Minerals (MULTIVITAMIN PO) Take by mouth daily.      . Omega-3 Fatty Acids (FISH OIL PO) Take by mouth 2 (two) times daily.      . pravastatin (PRAVACHOL) 20 MG tablet Take 1 tablet (20 mg total) by mouth daily.  90 tablet  3  . sertraline (ZOLOFT) 50 MG tablet Take 1/2 tablet every AM for anxiety or as directed.  90 tablet  3  . metroNIDAZOLE (METROGEL) 0.75 % gel Apply topically 2 (two) times daily.  45 g  3   No current facility-administered medications on file prior to visit.   No Known Allergies   Review of Systems  Constitutional: Positive for activity change. Negative for fever, chills and unexpected weight change.  Musculoskeletal: Positive for myalgias, joint swelling, arthralgias and gait problem. Negative for  back pain.  Skin: Positive for color change. Negative for pallor, rash and wound.  Neurological: Negative for weakness and numbness.      BP 110/70  Pulse 76  Temp(Src) 98.5 F (36.9 C) (Oral)  Resp 12  Ht 5\' 2"  (1.575 m)  Wt 210 lb (95.255 kg)  BMI 38.4 kg/m2  SpO2 98% Objective:   Physical Exam  Constitutional: She is oriented to person, place, and time. She appears well-developed and well-nourished. No distress.  HENT:  Head: Normocephalic and atraumatic.  Right Ear: External ear normal.  Eyes: Conjunctivae are normal. No scleral icterus.  Pulmonary/Chest: Effort normal.  Musculoskeletal:       Left ankle: She exhibits decreased range of motion, swelling and ecchymosis. She exhibits no deformity, no laceration and normal pulse. Tenderness. Lateral malleolus, head of 5th metatarsal and proximal fibula tenderness found. No medial malleolus, no AITFL, no CF ligament and no posterior TFL tenderness found.       Left foot: She exhibits decreased range of motion and bony tenderness. She exhibits no swelling, normal capillary refill and no deformity.  Neurological: She is alert and oriented to person, place, and time.  Skin: Skin is warm and dry. She is not diaphoretic. No erythema.  Psychiatric: She has a  normal mood and affect. Her behavior is normal.      [facility] reading (PRIMARY) by  Dr. Clelia Croft. Left ankle:  No lateral malleolus fracture seen but on overread there was again a non-displaced fracture of the tip of the lateral malleolus seen, unchanged from prior. Left foot.: proximal nondisplaced fracture in proximal head of 5th metatarsal, no sig change from prior Assessment & Plan:  Subtle left lateral malleolus fracture and non-displaced styloid avulsion fracture of 5th metatarsal - Cont touchdown-weight bearing x 4 days for a total of 2 wks, since pt is still having sig pain, then gradually increase partial weightbearing as tolerated and f/u in 10-14d for recheck to see if we can get  her off of crutches and if she is full weightbearing at that time, maybe even out of the CAM boot. Repeat xrays in 6 wks.

## 2012-11-06 ENCOUNTER — Ambulatory Visit (INDEPENDENT_AMBULATORY_CARE_PROVIDER_SITE_OTHER): Payer: PRIVATE HEALTH INSURANCE | Admitting: Physician Assistant

## 2012-11-06 VITALS — BP 122/80 | HR 110 | Temp 98.0°F | Resp 18 | Ht 62.0 in | Wt 212.0 lb

## 2012-11-06 DIAGNOSIS — M25572 Pain in left ankle and joints of left foot: Secondary | ICD-10-CM

## 2012-11-06 DIAGNOSIS — S8290XD Unspecified fracture of unspecified lower leg, subsequent encounter for closed fracture with routine healing: Secondary | ICD-10-CM

## 2012-11-06 DIAGNOSIS — M25579 Pain in unspecified ankle and joints of unspecified foot: Secondary | ICD-10-CM

## 2012-11-06 DIAGNOSIS — S92302D Fracture of unspecified metatarsal bone(s), left foot, subsequent encounter for fracture with routine healing: Secondary | ICD-10-CM

## 2012-11-06 NOTE — Progress Notes (Signed)
  Subjective:    Patient ID: Deanna Allen, female    DOB: 05-16-1951, 62 y.o.   MRN: [id number]  HPI 62 year old female presents for follow up of left ankle fracture. DOI 10/13/12 and seen here initially on 10/14/12. She is doing well and pain has improved significantly.  She has not used any pain medication for this.  Does elevate nightly as there is still some swelling at the lateral malleolus. The ecchymosis has improved greatly. She continues to wear CAM walker at all times and also is using crutches with partial weight bearing.  Has no pain with this and is ambulating short distances without crutches.  Overall she is pleased with the progress and has no additional concerns today.     Review of Systems  Constitutional: Negative for fever and chills.  Musculoskeletal: Positive for joint swelling and arthralgias.  Skin: Negative for color change and rash.  Neurological: Negative for dizziness and headaches.       Objective:   Physical Exam  Constitutional: She is oriented to person, place, and time. She appears well-developed and well-nourished.  HENT:  Head: Normocephalic and atraumatic.  Right Ear: External ear normal.  Left Ear: External ear normal.  Eyes: Conjunctivae are normal.  Cardiovascular: Normal rate.   Pulmonary/Chest: Effort normal.  Musculoskeletal:       Left ankle: She exhibits swelling (lateral malleolus). She exhibits no ecchymosis, no deformity, no laceration and normal pulse. Tenderness. Lateral malleolus and head of 5th metatarsal tenderness found.  Neurological: She is alert and oriented to person, place, and time.  Psychiatric: She has a normal mood and affect. Her behavior is normal. Judgment and thought content normal.          Assessment & Plan:  Metatarsal fracture, left, with routine healing, subsequent encounter  Pain in joint, ankle and foot, left  Continue to wear tall CAM. Ok to d/c crutches and try full weight bearing. If pain returns, ok  to use crutches with partial weight bearing.  Elevate nightly.  Follow up in 2 weeks for recheck.

## 2012-11-20 ENCOUNTER — Ambulatory Visit (INDEPENDENT_AMBULATORY_CARE_PROVIDER_SITE_OTHER): Payer: PRIVATE HEALTH INSURANCE | Admitting: Family Medicine

## 2012-11-20 ENCOUNTER — Ambulatory Visit: Payer: PRIVATE HEALTH INSURANCE

## 2012-11-20 VITALS — BP 126/72 | HR 78 | Temp 97.0°F | Resp 18 | Ht 63.0 in | Wt 215.4 lb

## 2012-11-20 DIAGNOSIS — M25579 Pain in unspecified ankle and joints of unspecified foot: Secondary | ICD-10-CM

## 2012-11-20 DIAGNOSIS — S8262XD Displaced fracture of lateral malleolus of left fibula, subsequent encounter for closed fracture with routine healing: Secondary | ICD-10-CM

## 2012-11-20 DIAGNOSIS — M25572 Pain in left ankle and joints of left foot: Secondary | ICD-10-CM

## 2012-11-20 DIAGNOSIS — S92302D Fracture of unspecified metatarsal bone(s), left foot, subsequent encounter for fracture with routine healing: Secondary | ICD-10-CM

## 2012-11-20 DIAGNOSIS — S8290XD Unspecified fracture of unspecified lower leg, subsequent encounter for closed fracture with routine healing: Secondary | ICD-10-CM

## 2012-11-20 NOTE — Progress Notes (Signed)
  Subjective:    Patient ID: Deanna Allen, female    DOB: [birth year], 62 y.o.   MRN: [id number]  HPI Now having less pain above the ankle - that is competely resolved, but having more pain on her proximal 5th metatarsal.  Is wearing the tall cam walker at all times but no longer needing crutchtes.  Still having swelling in her lateral ankle.  Thinks the way she is walking with the cam walker might be making hthings worse.    Review of Systems    BP 126/72  Pulse 78  Temp(Src) 97 F (36.1 C) (Oral)  Resp 18  Ht 5\' 3"  (1.6 m)  Wt 215 lb 6.4 oz (97.705 kg)  BMI 38.17 kg/m2  SpO2 97% Objective:   Physical Exam        [facility] reading (PRIMARY) by  Dr. Clelia Croft. Left ankle and foot.  Proximal left 5th metatarsal fracture still present but stable and non-displaced.  Lateral malleolar fracture with signs of healing.  Assessment & Plan:  Proximal 5th metatarsal fracture - styloid avulsion - - s/p 5 wks out, full weightbearing in tall camboot for 2 wks -  Switch to post-op shoe since she is still having pain and styloid fracture still evident on xray.  F/u in 2-3 wks and hopefully we can release her back to normal activity at that time.  She has some new balance tennis shoes that have a very firm sole and advised that she can try wearing these instead as long as she is not having any additional pain and swelling with this.  ILateral malleolar fracture fully treated w/ walking case for 4-6 wks  But if she has additional pain, we could consider puttiner her in a short cam boot for 2 additional weeks.  Start ankle rom exercises.  At f/u, if doiing well - start calf strethcing and strenghtening.

## 2012-11-26 ENCOUNTER — Other Ambulatory Visit: Payer: Self-pay | Admitting: Family Medicine

## 2012-11-26 DIAGNOSIS — Z1231 Encounter for screening mammogram for malignant neoplasm of breast: Secondary | ICD-10-CM

## 2012-11-29 ENCOUNTER — Ambulatory Visit (HOSPITAL_COMMUNITY)
Admission: RE | Admit: 2012-11-29 | Discharge: 2012-11-29 | Disposition: A | Discharge status: Home / Self Care | Payer: PRIVATE HEALTH INSURANCE | Source: Ambulatory Visit | Attending: Family Medicine | Admitting: Family Medicine

## 2012-11-29 DIAGNOSIS — Z1231 Encounter for screening mammogram for malignant neoplasm of breast: Secondary | ICD-10-CM | POA: Insufficient documentation

## 2012-12-10 ENCOUNTER — Telehealth: Payer: Self-pay

## 2012-12-10 ENCOUNTER — Encounter: Payer: Self-pay | Admitting: Family Medicine

## 2012-12-10 ENCOUNTER — Ambulatory Visit (INDEPENDENT_AMBULATORY_CARE_PROVIDER_SITE_OTHER): Payer: PRIVATE HEALTH INSURANCE | Admitting: Family Medicine

## 2012-12-10 VITALS — BP 148/91 | HR 86 | Ht 62.0 in | Wt 210.0 lb

## 2012-12-10 DIAGNOSIS — S99919A Unspecified injury of unspecified ankle, initial encounter: Secondary | ICD-10-CM

## 2012-12-10 DIAGNOSIS — S99912A Unspecified injury of left ankle, initial encounter: Secondary | ICD-10-CM

## 2012-12-10 DIAGNOSIS — S8990XA Unspecified injury of unspecified lower leg, initial encounter: Secondary | ICD-10-CM

## 2012-12-10 DIAGNOSIS — S99922A Unspecified injury of left foot, initial encounter: Secondary | ICD-10-CM

## 2012-12-10 NOTE — Telephone Encounter (Signed)
Patient is wanting to know why she has an appointment with Audria Nine on 5/21.  I explained to the patient that this is a 3 month reck from her visit in February.  Patient would like to know exactly why she has an appointment.  Please contact patient with this information.   Best#: [phone]

## 2012-12-10 NOTE — Patient Instructions (Addendum)
At this point the most important part of your treatment is rehabilitating your ankle injury. Start physical therapy and home exercises. Do home exercises every day for next 6 weeks. Icing 15 minutes at a time 3-4 times a day. Aleve 2 tabs twice a day with food OR ibuprofen 3 tabs three times a day with food as needed for inflammation. ASO (laceup ankle brace) for stability when up and walking around for next 6 weeks. Ok to use the walking boot if needed. Stop using the postop shoe though. Follow up with me in 4-6 weeks for reevaluation.

## 2012-12-11 ENCOUNTER — Encounter: Payer: Self-pay | Admitting: Family Medicine

## 2012-12-11 ENCOUNTER — Ambulatory Visit (INDEPENDENT_AMBULATORY_CARE_PROVIDER_SITE_OTHER): Payer: PRIVATE HEALTH INSURANCE | Admitting: Family Medicine

## 2012-12-11 VITALS — BP 138/80 | HR 78 | Temp 97.0°F | Resp 16 | Ht 63.5 in | Wt 217.0 lb

## 2012-12-11 DIAGNOSIS — Z760 Encounter for issue of repeat prescription: Secondary | ICD-10-CM

## 2012-12-11 DIAGNOSIS — S99912A Unspecified injury of left ankle, initial encounter: Secondary | ICD-10-CM

## 2012-12-11 DIAGNOSIS — F418 Other specified anxiety disorders: Secondary | ICD-10-CM

## 2012-12-11 DIAGNOSIS — F341 Dysthymic disorder: Secondary | ICD-10-CM

## 2012-12-11 DIAGNOSIS — S99922A Unspecified injury of left foot, initial encounter: Secondary | ICD-10-CM | POA: Insufficient documentation

## 2012-12-11 HISTORY — DX: Unspecified injury of left ankle, initial encounter: S99.912A

## 2012-12-11 MED ORDER — SERTRALINE HCL 50 MG PO TABS: ORAL_TABLET | ORAL | Status: DC

## 2012-12-11 MED ORDER — PRAVASTATIN SODIUM 20 MG PO TABS: 20.0000 mg | ORAL_TABLET | Freq: Every day | ORAL | Status: DC

## 2012-12-11 MED ORDER — LORAZEPAM 2 MG PO TABS: ORAL_TABLET | ORAL | Status: DC

## 2012-12-11 NOTE — Telephone Encounter (Signed)
She is to be seen for follow up of her anxiety. Called her to advise.

## 2012-12-11 NOTE — Assessment & Plan Note (Signed)
s/p both base 5th metatarsal avulsion fracture and distal fibula avulsion fracture.  No symptoms at this point regarding the 5th metatarsal avulsion fracture.  She still has evidence of ankle instability and most of her symptoms relate to this.  Start formal physical therapy and home exercise program.  Icing, aleve.  Laceup ankle brace for stability.  D/c using postop shoe. Only wear walking boot as needed (discussed prefer she use brace for stability instead at this point).  F/u in 4-6 weeks. 

## 2012-12-11 NOTE — Patient Instructions (Addendum)
I have refilled your medications for 1 year; contact the office if you have any problems. I will see you in 1 year or sooner if needed.

## 2012-12-11 NOTE — Assessment & Plan Note (Signed)
s/p both base 5th metatarsal avulsion fracture and distal fibula avulsion fracture.  No symptoms at this point regarding the 5th metatarsal avulsion fracture.  She still has evidence of ankle instability and most of her symptoms relate to this.  Start formal physical therapy and home exercise program.  Icing, aleve.  Laceup ankle brace for stability.  D/c using postop shoe. Only wear walking boot as needed (discussed prefer she use brace for stability instead at this point).  F/u in 4-6 weeks.

## 2012-12-11 NOTE — Progress Notes (Signed)
S:  This 62 y.o. Cauc female has chronic depression w/ anxiety, well controlled on Sertraline 25 mg daily. She had been seeing a psychiatrist for years but was advised that medication could be handled by PCP. Pt has no adverse reactions w/ this medication; she takes Lorazepam almost nightly for sleep.  May is a difficult month due to anniversary of parents' deaths as well as her mother's birthday, pt's birthday and Mother's Day. Mental overactivity is a problem; pt has tried meditation w/ some success. She has good sleep hygiene; she denies fatigue, abnormal weight change or appetite, CV, GI or PULM problems. She has no thoughts of self harm.    Her most recent health problem is L ankle fracture; she sought ORTHO care at a sports med facility after initial evaluation here last month; she will be following up with Dr. Pearletha Forge.  PMHx, Soc Hx and Fam Hx reviewed.  ROS: As per HPI. Noncontributory.  O:  Filed Vitals:   12/11/12 1147  BP: 138/80  Pulse: 78  Temp: 97 F (36.1 C)  Resp: 16   GEN: In NAD; WN,WD. HENT: [place]/AT; EOMI w/ clear conj/ sclerae. Otherwise normal. COR: RRR. LUNGS: Normal resp rate and effort. SKIN: W&D. No pallor. MS: L foot in CAM walker. NEURO: A&O x 3; CNs intact. Pleasant affect/ smiling with calm demeanor; speech pattern and thought content normal. Judgement sound.  A/P: Depression with anxiety- stable; continue current medications.  Issue of repeat prescriptions  Meds ordered this encounter  Medications  . DISCONTD: pravastatin (PRAVACHOL) 20 MG tablet    Sig: Take 20 mg by mouth daily. NEED REFILL 90 DAYS  . DISCONTD: sertraline (ZOLOFT) 50 MG tablet    Sig: Take 1/2 tablet every AM for anxiety or as directed. NEED REFILL 90 DAYS  . sertraline (ZOLOFT) 50 MG tablet    Sig: Take 1/2 - 1 tablet every AM for anxiety or as directed.    Dispense:  90 tablet    Refill:  3  . pravastatin (PRAVACHOL) 20 MG tablet    Sig: Take 1 tablet (20 mg total) by mouth  daily. NEED REFILL 90 DAYS    Dispense:  90 tablet    Refill:  3  . LORazepam (ATIVAN) 2 MG tablet -phoned in    Sig: Take 1/2 - 1 tablet hs for sleep.    Dispense:  30 tablet    Refill:  5

## 2012-12-11 NOTE — Progress Notes (Signed)
Subjective:    Patient ID: Deanna Allen, female    DOB: 03-Aug-1950, 62 y.o.   MRN: [id number]  PCP: Dow Adolph  HPI 62 yo F here for left ankle swelling.  Patient reports on 3/23 she went to get up at night and her foot was asleep. Put weight on it and left ankle inverted causing a 'pop' and pain lateral ankle and foot. Swelling in both areas. Went to urgent care the following day - diagnosed with a 5th metatarsal avulsion fracture and a lateral malleolus tip avulsion fracture as well. She used a cam walker with crutches for a month then only the cam walker for 2 weeks. Started using postop shoe 3 weeks ago. States her pain has improved, especially over past 2-3 weeks. However by end of day she has pretty bad swelling lateral left ankle. Only time she has pain is if she turns her ankle the wrong way.  No foot pain now. Ankle still feels unstable. Rarely takes ibuprofen.  Past Medical History  Diagnosis Date  . Arthritis   . Cancer     basal cell CA-face  . Hyperlipidemia   . Depression     Current Outpatient Prescriptions on File Prior to Visit  Medication Sig Dispense Refill  . aspirin 81 MG tablet Take 81 mg by mouth daily.      . calcium carbonate (OS-CAL) 600 MG TABS Take 600 mg by mouth 2 (two) times daily with a meal.      . Cholecalciferol (VITAMIN D PO) Take 1 tablet by mouth 2 (two) times daily.      Marland Kitchen GLUCOSAMINE-CHONDROITIN PO Take 1 tablet by mouth 2 (two) times daily.      Marland Kitchen LORazepam (ATIVAN) 2 MG tablet Take 1/2 - 1 tablet hs for sleep.  30 tablet  2  . Multiple Vitamins-Minerals (MULTIVITAMIN PO) Take by mouth daily.      . Omega-3 Fatty Acids (FISH OIL PO) Take by mouth 2 (two) times daily.       No current facility-administered medications on file prior to visit.    Past Surgical History  Procedure Laterality Date  . Colonoscopy    . Knee surgery  2001    right    No Known Allergies  History   Social History  . Marital Status:  Married    Spouse Name: N/A    Number of Children: N/A  . Years of Education: N/A   Occupational History  . Not on file.   Social History Main Topics  . Smoking status: Never Smoker   . Smokeless tobacco: Never Used  . Alcohol Use: No  . Drug Use: No  . Sexually Active: Not on file   Other Topics Concern  . Not on file   Social History Narrative  . No narrative on file    Family History  Problem Relation Age of Onset  . Colon cancer Father 54  . Leukemia Father   . Heart attack Father   . Hyperlipidemia Father   . Hypertension Father   . Colon cancer Maternal Grandmother     DX  AGE 43'S  . Alcohol abuse Maternal Grandmother   . Esophageal cancer Neg Hx   . Stomach cancer Neg Hx   . Rectal cancer Neg Hx   . Diabetes Neg Hx   . Sudden death Neg Hx   . Muscular dystrophy Mother 41  . Alcohol abuse Maternal Grandfather   . Emphysema Maternal Grandfather     BP 148/91  Pulse 86  Ht 5\' 2"  (1.575 m)  Wt 210 lb (95.255 kg)  BMI 38.4 kg/m2  Review of Systems See HPI above.    Objective:   Physical Exam Gen: NAD  L foot/ankle: Mild lateral ankle swelling, no foot swelling.  No bruising, other deformity. FROM ankle though slow with motion.  Strength grossly 5/5 all directions - pain with ext rotation. Minimal tenderness distal fibula tip.  No base 5th, navicular, medial malleolus, other foot/ankle tenderness. 2+ ant drawer and talar tilt. Negative syndesmotic compression. Thompsons test negative. NV intact distally.    Assessment & Plan:  1. Left foot/ankle pain and swelling - s/p both base 5th metatarsal avulsion fracture and distal fibula avulsion fracture.  No symptoms at this point regarding the 5th metatarsal avulsion fracture.  She still has evidence of ankle instability and most of her symptoms relate to this.  Start formal physical therapy and home exercise program.  Icing, aleve.  Laceup ankle brace for stability.  D/c using postop shoe. Only wear  walking boot as needed (discussed prefer she use brace for stability instead at this point).  F/u in 4-6 weeks.

## 2012-12-30 ENCOUNTER — Ambulatory Visit: Payer: PRIVATE HEALTH INSURANCE | Attending: Family Medicine | Admitting: Physical Therapy

## 2012-12-30 DIAGNOSIS — M25676 Stiffness of unspecified foot, not elsewhere classified: Secondary | ICD-10-CM | POA: Insufficient documentation

## 2012-12-30 DIAGNOSIS — M25673 Stiffness of unspecified ankle, not elsewhere classified: Secondary | ICD-10-CM | POA: Insufficient documentation

## 2012-12-30 DIAGNOSIS — IMO0001 Reserved for inherently not codable concepts without codable children: Secondary | ICD-10-CM | POA: Insufficient documentation

## 2012-12-30 DIAGNOSIS — M25579 Pain in unspecified ankle and joints of unspecified foot: Secondary | ICD-10-CM | POA: Insufficient documentation

## 2013-01-14 ENCOUNTER — Encounter: Payer: Self-pay | Admitting: Family Medicine

## 2013-01-14 ENCOUNTER — Ambulatory Visit (INDEPENDENT_AMBULATORY_CARE_PROVIDER_SITE_OTHER): Payer: PRIVATE HEALTH INSURANCE | Admitting: Family Medicine

## 2013-01-14 VITALS — BP 123/75 | HR 97 | Ht 62.0 in | Wt 210.0 lb

## 2013-01-14 DIAGNOSIS — S99912D Unspecified injury of left ankle, subsequent encounter: Secondary | ICD-10-CM

## 2013-01-14 DIAGNOSIS — Z5189 Encounter for other specified aftercare: Secondary | ICD-10-CM

## 2013-01-14 NOTE — Assessment & Plan Note (Signed)
Left foot/ankle pain and swelling - improved.  Some instability still - stressed importance of strengthening exercises - do regularly for next 6 weeks then can back down to 2-3 times a week.  Continue ASO for 6 more weeks then as needed.  Initial injuries base 5th metatarsal avulsion fracture and distal fibula avulsion fracture - clinically healed.  Icing, aleve prn.  F/u prn.

## 2013-01-14 NOTE — Progress Notes (Signed)
Subjective:    Patient ID: Deanna Allen, female    DOB: Apr 11, 1951, 62 y.o.   MRN: [id number]  PCP: Dow Adolph  HPI  62 yo F here for f/u left ankle swelling.  5/20: Patient reports on 3/23 she went to get up at night and her foot was asleep. Put weight on it and left ankle inverted causing a 'pop' and pain lateral ankle and foot. Swelling in both areas. Went to urgent care the following day - diagnosed with a 5th metatarsal avulsion fracture and a lateral malleolus tip avulsion fracture as well. She used a cam walker with crutches for a month then only the cam walker for 2 weeks. Started using postop shoe 3 weeks ago. States her pain has improved, especially over past 2-3 weeks. However by end of day she has pretty bad swelling lateral left ankle. Only time she has pain is if she turns her ankle the wrong way.  No foot pain now. Ankle still feels unstable. Rarely takes ibuprofen.  6/24: Patient reports she feels much better compared to last visit. Did PT for one visit to learn home exercise program and doing this regularly. Wearing ASO - advised to use for additional 6 weeks. Not needing any medicines or icing. Occasional twinge lateral ankle if turns the wrong way. Swelling improved but still present.  Past Medical History  Diagnosis Date  . Arthritis   . Cancer     basal cell CA-face  . Hyperlipidemia   . Anxiety     Current Outpatient Prescriptions on File Prior to Visit  Medication Sig Dispense Refill  . aspirin 81 MG tablet Take 81 mg by mouth daily.      . calcium carbonate (OS-CAL) 600 MG TABS Take 600 mg by mouth 2 (two) times daily with a meal.      . Cholecalciferol (VITAMIN D PO) Take 1 tablet by mouth 2 (two) times daily.      Marland Kitchen GLUCOSAMINE-CHONDROITIN PO Take 1 tablet by mouth 2 (two) times daily.      Marland Kitchen LORazepam (ATIVAN) 2 MG tablet Take 1/2 - 1 tablet hs for sleep.  30 tablet  5  . Multiple Vitamins-Minerals (MULTIVITAMIN PO) Take by mouth  daily.      . Omega-3 Fatty Acids (FISH OIL PO) Take by mouth 2 (two) times daily.      . pravastatin (PRAVACHOL) 20 MG tablet Take 1 tablet (20 mg total) by mouth daily. NEED REFILL 90 DAYS  90 tablet  3  . sertraline (ZOLOFT) 50 MG tablet Take 1/2 - 1 tablet every AM for anxiety or as directed.  90 tablet  3   No current facility-administered medications on file prior to visit.    Past Surgical History  Procedure Laterality Date  . Colonoscopy    . Knee surgery  2001    right    No Known Allergies  History   Social History  . Marital Status: Married    Spouse Name: N/A    Number of Children: N/A  . Years of Education: N/A   Occupational History  . Not on file.   Social History Main Topics  . Smoking status: Never Smoker   . Smokeless tobacco: Never Used  . Alcohol Use: Yes     Comment: 2-3 a month  . Drug Use: No  . Sexually Active: Yes     Comment: number of sex partners in the last 12 months  1   Other Topics Concern  . Not  on file   Social History Narrative  . No narrative on file    Family History  Problem Relation Age of Onset  . Colon cancer Father 64  . Leukemia Father   . Heart attack Father   . Hyperlipidemia Father   . Hypertension Father   . Colon cancer Maternal Grandmother     DX  AGE 27'S  . Alcohol abuse Maternal Grandmother   . Esophageal cancer Neg Hx   . Stomach cancer Neg Hx   . Rectal cancer Neg Hx   . Diabetes Neg Hx   . Sudden death Neg Hx   . Muscular dystrophy Mother 86  . Alcohol abuse Maternal Grandfather   . Emphysema Maternal Grandfather     BP 123/75  Pulse 97  Ht 5\' 2"  (1.575 m)  Wt 210 lb (95.255 kg)  BMI 38.4 kg/m2  Review of Systems  See HPI above.    Objective:   Physical Exam  Gen: NAD  L foot/ankle: Mild lateral ankle swelling, no foot swelling.  No bruising, other deformity. FROM ankle though slow with motion.  Strength grossly 5/5 all directions without pain. No tenderness distal fibula tip.  No  base 5th, navicular, medial malleolus, other foot/ankle tenderness. 1+ ant drawer and talar tilt. Negative syndesmotic compression. Thompsons test negative. NV intact distally.    Assessment & Plan:  1. Left foot/ankle pain and swelling - improved.  Some instability still - stressed importance of strengthening exercises - do regularly for next 6 weeks then can back down to 2-3 times a week.  Continue ASO for 6 more weeks then as needed.  Initial injuries base 5th metatarsal avulsion fracture and distal fibula avulsion fracture - clinically healed.  Icing, aleve prn.  F/u prn.

## 2013-01-14 NOTE — Patient Instructions (Addendum)
ASO, exercises regularly for another 6 weeks. Follow up as needed.

## 2013-06-07 ENCOUNTER — Ambulatory Visit: Payer: No Typology Code available for payment source

## 2013-06-07 ENCOUNTER — Ambulatory Visit (INDEPENDENT_AMBULATORY_CARE_PROVIDER_SITE_OTHER): Payer: PRIVATE HEALTH INSURANCE | Admitting: Emergency Medicine

## 2013-06-07 VITALS — BP 160/90 | HR 95 | Temp 99.0°F | Resp 18 | Ht 63.0 in | Wt 222.2 lb

## 2013-06-07 DIAGNOSIS — R059 Cough, unspecified: Secondary | ICD-10-CM

## 2013-06-07 DIAGNOSIS — J209 Acute bronchitis, unspecified: Secondary | ICD-10-CM

## 2013-06-07 DIAGNOSIS — R05 Cough: Secondary | ICD-10-CM

## 2013-06-07 LAB — POCT CBC
Hemoglobin: 14.7 g/dL (ref 12.2–16.2)
Lymph, poc: 2.3 (ref 0.6–3.4)
MCH, POC: 29.9 pg (ref 27–31.2)
MCHC: 31.1 g/dL — AB (ref 31.8–35.4)
MCV: 96.4 fL (ref 80–97)
MPV: 7.5 fL (ref 0–99.8)
POC Granulocyte: 2.6 (ref 2–6.9)
POC MID %: 8.2 %M (ref 0–12)
Platelet Count, POC: 214 10*3/uL (ref 142–424)
RDW, POC: 13.8 %

## 2013-06-07 MED ORDER — ALBUTEROL SULFATE HFA 108 (90 BASE) MCG/ACT IN AERS: 2.0000 | INHALATION_SPRAY | RESPIRATORY_TRACT | Status: DC | PRN

## 2013-06-07 MED ORDER — ALBUTEROL SULFATE (2.5 MG/3ML) 0.083% IN NEBU
2.5000 mg | INHALATION_SOLUTION | Freq: Once | RESPIRATORY_TRACT | Status: AC
  Administered 2013-06-07: 2.5 mg via RESPIRATORY_TRACT

## 2013-06-07 MED ORDER — PREDNISONE 20 MG PO TABS: ORAL_TABLET | ORAL | Status: DC

## 2013-06-07 MED ORDER — HYDROCODONE-HOMATROPINE 5-1.5 MG/5ML PO SYRP: ORAL_SOLUTION | ORAL | Status: DC

## 2013-06-07 MED ORDER — AZITHROMYCIN 250 MG PO TABS: ORAL_TABLET | ORAL | Status: DC

## 2013-06-07 NOTE — Patient Instructions (Signed)

## 2013-06-07 NOTE — Progress Notes (Signed)
  Subjective:    Patient ID: Deanna Allen, female    DOB: 03/05/1951, 62 y.o.   MRN: [id number]  HPI Patient presents with cough. No fever, ST. Never smoker. Wheezing. Patient states this cough happens yearly around this time. She states every year she gets this illness gets treated that the medications did not seem to help. The illness seems to run its course and then she gets better. She is a nonsmoker     Review of Systems     Objective:   Physical Exam HEENT exam TMs are not seen there is wax present in both canals. Throat is normal. Chest exam reveals bilateral rhonchi. There are wheezes present bilaterally. Cardiac reveals regular rate no murmurs.  [facility] reading (PRIMARY) by  Dr. Cleta Alberts  increase markings in both bases . Results for orders placed in visit on 06/07/13  POCT CBC      Result Value Range   WBC 5.4  4.6 - 10.2 K/uL   Lymph, poc 2.3  0.6 - 3.4   POC LYMPH PERCENT 43.0  10 - 50 %L   MID (cbc) 0.4  0 - 0.9   POC MID % 8.2  0 - 12 %M   POC Granulocyte 2.6  2 - 6.9   Granulocyte percent 48.8  37 - 80 %G   RBC 4.91  4.04 - 5.48 M/uL   Hemoglobin 14.7  12.2 - 16.2 g/dL   HCT, POC 40.9  81.1 - 47.9 %   MCV 96.4  80 - 97 fL   MCH, POC 29.9  27 - 31.2 pg   MCHC 31.1 (*) 31.8 - 35.4 g/dL   RDW, POC 91.4     Platelet Count, POC 214  142 - 424 K/uL   MPV 7.5  0 - 99.8 fL         Assessment & Plan:  She did improve with her inhaler. We'll treat with a Z-Pak albuterol HFA and a prednisone taper. She was also given some Hycodan to take PRN.

## 2013-06-14 ENCOUNTER — Ambulatory Visit: Payer: No Typology Code available for payment source

## 2013-06-14 ENCOUNTER — Ambulatory Visit (INDEPENDENT_AMBULATORY_CARE_PROVIDER_SITE_OTHER): Payer: PRIVATE HEALTH INSURANCE | Admitting: Family Medicine

## 2013-06-14 VITALS — BP 108/64 | HR 106 | Temp 97.8°F | Resp 20 | Ht 62.0 in | Wt 215.8 lb

## 2013-06-14 DIAGNOSIS — R058 Other specified cough: Secondary | ICD-10-CM

## 2013-06-14 DIAGNOSIS — J209 Acute bronchitis, unspecified: Secondary | ICD-10-CM

## 2013-06-14 DIAGNOSIS — R05 Cough: Secondary | ICD-10-CM

## 2013-06-14 DIAGNOSIS — R059 Cough, unspecified: Secondary | ICD-10-CM

## 2013-06-14 MED ORDER — LEVOFLOXACIN 500 MG PO TABS: 500.0000 mg | ORAL_TABLET | Freq: Every day | ORAL | Status: DC

## 2013-06-14 MED ORDER — BENZONATATE 100 MG PO CAPS: 100.0000 mg | ORAL_CAPSULE | Freq: Three times a day (TID) | ORAL | Status: DC | PRN

## 2013-06-14 MED ORDER — IPRATROPIUM BROMIDE 0.02 % IN SOLN
0.5000 mg | Freq: Once | RESPIRATORY_TRACT | Status: AC
  Administered 2013-06-14: 0.5 mg via RESPIRATORY_TRACT

## 2013-06-14 MED ORDER — ALBUTEROL SULFATE (2.5 MG/3ML) 0.083% IN NEBU
2.5000 mg | INHALATION_SOLUTION | Freq: Once | RESPIRATORY_TRACT | Status: AC
  Administered 2013-06-14: 2.5 mg via RESPIRATORY_TRACT

## 2013-06-14 MED ORDER — HYDROCOD POLST-CHLORPHEN POLST 10-8 MG/5ML PO LQCR: 5.0000 mL | Freq: Two times a day (BID) | ORAL | Status: DC | PRN

## 2013-06-14 NOTE — Progress Notes (Signed)
Subjective: 62 year old lady who has a seasonal cough each year at this time endolaser falling. She came in here last week but has a three-week history of coughing. She was treated aggressively by Dr. Cleta Alberts with albuterol, prednisone, hydrocodone cough syrup, and azithromycin. She may have improved a little bit, but then she's got worse again. She has 2 more days of prednisone taper left. She is coughing constantly. Use the cough syrup.  Objective: She sounds pretty miserable with a constant wheezy cough. Throat is here clear. Neck supple without nodes. Chest has this a cough with deep inspiration. She has some very mild rhonchi at the bases and a wheeze when she coughs or exhales her sleep. Peak flow was 345, with last week being 290.  Assessment: Post viral asthmatic bronchitis  Plan: Repeat chest x-ray Give albuterol treatment with Atrovent  [facility] reading (PRIMARY) by  Dr. Alwyn Ren Normal  Looks clear from last week's films  .

## 2013-06-14 NOTE — Patient Instructions (Signed)
Drink plenty of fluids and get enough rest  Use the albuterol inhaler every 4-6 hours as needed for coughing and wheezing  Take Tessalon (benzatonate) 1-2 tablets 3 times daily as needed for cough  Take Tussionex cough syrup 1 teaspoon every 12 hours as needed for cough. It will make you sedated usually.  Take Levaquin one daily for infection  These post viral prolonged coughing episodes him to need to have the cough suppressed to break the cough irritated cough type cycle.  Return if worse at anytime or not improving.

## 2013-06-25 ENCOUNTER — Other Ambulatory Visit: Payer: Self-pay | Admitting: Family Medicine

## 2013-06-26 NOTE — Telephone Encounter (Signed)
Lorazepam refill phoned to pt's pharmacy. Pt needs to sch follow-up in ~4 months.

## 2013-10-28 ENCOUNTER — Other Ambulatory Visit: Payer: Self-pay | Admitting: Family Medicine

## 2013-10-28 NOTE — Telephone Encounter (Signed)
Lorazepam refills phoned to pt's pharmacy. 

## 2013-11-26 ENCOUNTER — Ambulatory Visit (INDEPENDENT_AMBULATORY_CARE_PROVIDER_SITE_OTHER): Payer: No Typology Code available for payment source | Admitting: Family Medicine

## 2013-11-26 VITALS — BP 120/64 | HR 101 | Temp 98.7°F | Resp 18 | Ht 62.0 in | Wt 220.0 lb

## 2013-11-26 DIAGNOSIS — R05 Cough: Secondary | ICD-10-CM

## 2013-11-26 DIAGNOSIS — R059 Cough, unspecified: Secondary | ICD-10-CM

## 2013-11-26 DIAGNOSIS — R058 Other specified cough: Secondary | ICD-10-CM

## 2013-11-26 DIAGNOSIS — J209 Acute bronchitis, unspecified: Secondary | ICD-10-CM

## 2013-11-26 MED ORDER — LEVOFLOXACIN 500 MG PO TABS
500.0000 mg | ORAL_TABLET | Freq: Every day | ORAL | Status: DC
Start: 2013-11-26 — End: 2014-01-13

## 2013-11-26 MED ORDER — HYDROCODONE-HOMATROPINE 5-1.5 MG/5ML PO SYRP: ORAL_SOLUTION | ORAL | Status: DC

## 2013-11-26 NOTE — Patient Instructions (Signed)
Use the levaquin as directed.  You can also use the cough syrup as needed, but remember it will make you sleepy.  Do not combine with ativan.  Let me know if you do not feel better in the next few days- Sooner if worse.

## 2013-11-26 NOTE — Progress Notes (Signed)
[facility] [address] [place] [id number] [phone]  Date:  11/26/2013   Name:  Deanna Allen   DOB:  [birth year]   MRN:  [id number]  PCP:  Ellsworth Lennox, MD    Chief Complaint: Cough, Sore Throat and Head Congestion   History of Present Illness:  Deanna Allen is a 63 y.o. very pleasant female patient who presents with the following:  Here today with illness- she has noted a cough, sore throat, headache and ear pain (bilaterally).  She feels that this started with "allergies and cough" about one week ago. "every day I get worse."   Menopausal.  History of anxiety and high cholesterol, basal cell skin cancer.   She had a mild pneumonia in November.   She had a lot of friends coming to visit her from [place] next week for a reunion She has not been aware of any fever, she has noted aches and chills however.   No GI symptoms.   She does not have any drug allergies.    She notes a painful and severe cough- occasionally productive.   Her grandchildren recently visited they were ill but she was already sick when they visited her.    So far she has not used any OTC medications.    Patient Active Problem List   Diagnosis Date Noted  . Injury of left foot 12/11/2012  . Left ankle injury 12/11/2012  . Insomnia, persistent 04/25/2012  . History of anxiety disorder 04/25/2012  . Obesity, Class III, BMI 40-49.9 (morbid obesity) 04/25/2012  . Hyperlipidemia 04/25/2012    Past Medical History  Diagnosis Date  . Arthritis   . Cancer     basal cell CA-face  . Hyperlipidemia   . Anxiety     Past Surgical History  Procedure Laterality Date  . Colonoscopy    . Knee surgery  2001    right    History  Substance Use Topics  . Smoking status: Never Smoker   . Smokeless tobacco: Never Used  . Alcohol Use: Yes     Comment: 2-3 a month    Family History  Problem Relation Age of Onset  . Colon cancer Father 3  . Leukemia Father   .  Heart attack Father   . Hyperlipidemia Father   . Hypertension Father   . Colon cancer Maternal Grandmother     DX  AGE 55'S  . Alcohol abuse Maternal Grandmother   . Esophageal cancer Neg Hx   . Stomach cancer Neg Hx   . Rectal cancer Neg Hx   . Diabetes Neg Hx   . Sudden death Neg Hx   . Muscular dystrophy Mother 27  . Alcohol abuse Maternal Grandfather   . Emphysema Maternal Grandfather     No Known Allergies  Medication list has been reviewed and updated.  Current Outpatient Prescriptions on File Prior to Visit  Medication Sig Dispense Refill  . albuterol (PROVENTIL HFA;VENTOLIN HFA) 108 (90 BASE) MCG/ACT inhaler Inhale 2 puffs into the lungs every 4 (four) hours as needed for wheezing or shortness of breath (cough, shortness of breath or wheezing.).  1 Inhaler  1  . aspirin 81 MG tablet Take 81 mg by mouth daily.      . calcium carbonate (OS-CAL) 600 MG TABS Take 600 mg by mouth 2 (two) times daily with a meal.      . Cholecalciferol (VITAMIN D PO) Take 1 tablet by mouth 2 (two) times daily.      Marland Kitchen  co-enzyme Q-10 50 MG capsule Take 50 mg by mouth daily.      Marland Kitchen doxycycline (VIBRAMYCIN) 100 MG capsule       . GLUCOSAMINE-CHONDROITIN PO Take 1 tablet by mouth 2 (two) times daily.      Marland Kitchen levofloxacin (LEVAQUIN) 500 MG tablet Take 1 tablet (500 mg total) by mouth daily.  7 tablet  0  . LORazepam (ATIVAN) 2 MG tablet TAKE 1/2 TO 1 TABLET AT BEDTIME FOR SLEEP.  30 tablet  3  . Multiple Vitamins-Minerals (MULTIVITAMIN PO) Take by mouth daily.      . Omega-3 Fatty Acids (FISH OIL PO) Take by mouth 2 (two) times daily.      . pravastatin (PRAVACHOL) 20 MG tablet Take 1 tablet (20 mg total) by mouth daily. NEED REFILL 90 DAYS  90 tablet  3  . sertraline (ZOLOFT) 50 MG tablet Take 1/2 - 1 tablet every AM for anxiety or as directed.  90 tablet  3  . azithromycin (ZITHROMAX) 250 MG tablet Take 2 tabs PO x 1 dose, then 1 tab PO QD x 4 days  6 tablet  0  . benzonatate (TESSALON) 100 MG  capsule Take 1-2 capsules (100-200 mg total) by mouth 3 (three) times daily as needed for cough.  30 capsule  0  . chlorpheniramine-HYDROcodone (TUSSIONEX PENNKINETIC ER) 10-8 MG/5ML LQCR Take 5 mLs by mouth every 12 (twelve) hours as needed for cough (cough).  115 mL  0  . HYDROcodone-homatropine (HYCODAN) 5-1.5 MG/5ML syrup Take 1 teaspoon at night for cough  120 mL  0  . predniSONE (DELTASONE) 20 MG tablet 3 a day for 3 days 2 a day for 3 days one a day for 3 days  18 tablet  0   No current facility-administered medications on file prior to visit.    Review of Systems:  As per HPI- otherwise negative. Denies any SOB today   Physical Examination: Filed Vitals:   11/26/13 1040  BP: 120/64  Pulse: 114  Temp: 98.7 F (37.1 C)  Resp: 18   Filed Vitals:   11/26/13 1040  Height: 5\' 2"  (1.575 m)  Weight: 220 lb (99.791 kg)   Body mass index is 40.23 kg/(m^2). Ideal Body Weight: Weight in (lb) to have BMI = 25: 136.4  GEN: WDWN, NAD, Non-toxic, A & O x 3, obese, coughing. Looks well HEENT: Atraumatic, Normocephalic. Neck supple. No masses, No LAD.  Bilateral TM wnl, oropharynx normal.  PEERL,EOMI.   Ears and Nose: No external deformity. CV: RRR, No M/G/R. No JVD. No thrill. No extra heart sounds. PULM: CTA B, no wheezes, crackles, rhonchi. No retractions. No resp. distress. No accessory muscle use. EXTR: No c/c/e NEURO Normal gait.  PSYCH: Normally interactive. Conversant. Not depressed or anxious appearing.  Calm demeanor.    Assessment and Plan: Cough - Plan: levofloxacin (LEVAQUIN) 500 MG tablet, HYDROcodone-homatropine (HYCODAN) 5-1.5 MG/5ML syrup  Post-viral cough syndrome - Plan: levofloxacin (LEVAQUIN) 500 MG tablet  Acute bronchitis  Treat with levaquin and cough syrup as needed.   Close follow-up if not better in the next few days- Sooner if worse.  Discussed minimal tachycardia. She does not have any SOB- she will report if this occurs      Signed Lamar Blinks, MD

## 2013-12-30 ENCOUNTER — Other Ambulatory Visit: Payer: Self-pay | Admitting: Family Medicine

## 2014-01-08 ENCOUNTER — Other Ambulatory Visit: Payer: Self-pay | Admitting: Family Medicine

## 2014-01-08 DIAGNOSIS — Z1231 Encounter for screening mammogram for malignant neoplasm of breast: Secondary | ICD-10-CM

## 2014-01-09 ENCOUNTER — Ambulatory Visit: Payer: No Typology Code available for payment source | Admitting: Family Medicine

## 2014-01-13 ENCOUNTER — Encounter: Payer: Self-pay | Admitting: Family Medicine

## 2014-01-13 ENCOUNTER — Ambulatory Visit (INDEPENDENT_AMBULATORY_CARE_PROVIDER_SITE_OTHER): Payer: No Typology Code available for payment source | Admitting: Family Medicine

## 2014-01-13 VITALS — BP 126/70 | HR 91 | Temp 97.7°F | Resp 18 | Ht 62.0 in | Wt 215.0 lb

## 2014-01-13 DIAGNOSIS — G4700 Insomnia, unspecified: Secondary | ICD-10-CM

## 2014-01-13 DIAGNOSIS — E785 Hyperlipidemia, unspecified: Secondary | ICD-10-CM

## 2014-01-13 LAB — COMPLETE METABOLIC PANEL WITH GFR
ALBUMIN: 4.3 g/dL (ref 3.5–5.2)
ALK PHOS: 113 U/L (ref 39–117)
ALT: 18 U/L (ref 0–35)
AST: 23 U/L (ref 0–37)
BUN: 15 mg/dL (ref 6–23)
CALCIUM: 9.5 mg/dL (ref 8.4–10.5)
CO2: 28 mEq/L (ref 19–32)
Chloride: 104 mEq/L (ref 96–112)
Creat: 0.92 mg/dL (ref 0.50–1.10)
GFR, EST NON AFRICAN AMERICAN: 66 mL/min
GFR, Est African American: 76 mL/min
Glucose, Bld: 86 mg/dL (ref 70–99)
POTASSIUM: 4.9 meq/L (ref 3.5–5.3)
Sodium: 139 mEq/L (ref 135–145)
Total Bilirubin: 0.8 mg/dL (ref 0.2–1.2)
Total Protein: 7 g/dL (ref 6.0–8.3)

## 2014-01-13 LAB — LIPID PANEL
Cholesterol: 199 mg/dL (ref 0–200)
HDL: 44 mg/dL (ref >39)
LDL Cholesterol: 134 mg/dL — ABNORMAL HIGH (ref 0–99)
Total CHOL/HDL Ratio: 4.5 Ratio
Triglycerides: 107 mg/dL (ref <150)
VLDL: 21 mg/dL (ref 0–40)

## 2014-01-13 MED ORDER — LORAZEPAM 2 MG PO TABS: ORAL_TABLET | ORAL | Status: DC

## 2014-01-13 MED ORDER — PRAVASTATIN SODIUM 20 MG PO TABS: 20.0000 mg | ORAL_TABLET | Freq: Every day | ORAL | Status: DC

## 2014-01-13 NOTE — Progress Notes (Signed)
S:  This 63 y.o. Cauc female is here for medication refill and serum lipid levels. She is compliant w/ medications w/o adverse effects (fatigue, abnormal weight change, vision changes, chest discomfort or palpitations, GI upset, myalgias, HA, dizziness, weakness or numbness). She has persistent sleep disturbance; Lorazepam is effective.  Patient Active Problem List   Diagnosis Date Noted  . Injury of left foot 12/11/2012  . Left ankle injury 12/11/2012  . Insomnia, persistent 04/25/2012  . History of anxiety disorder 04/25/2012  . Obesity, Class III, BMI 40-49.9 (morbid obesity) 04/25/2012  . Hyperlipidemia 04/25/2012    Prior to Admission medications   Medication Sig Start Date End Date Taking? Authorizing Provider  albuterol (PROVENTIL HFA;VENTOLIN HFA) 108 (90 BASE) MCG/ACT inhaler Inhale 2 puffs into the lungs every 4 (four) hours as needed for wheezing or shortness of breath (cough, shortness of breath or wheezing.). 06/07/13  Yes Darlyne Russian, MD  aspirin 81 MG tablet Take 81 mg by mouth daily.   Yes Historical Provider, MD  calcium carbonate (OS-CAL) 600 MG TABS Take 600 mg by mouth 2 (two) times daily with a meal.   Yes Historical Provider, MD  Cholecalciferol (VITAMIN D PO) Take 1 tablet by mouth 2 (two) times daily.   Yes Historical Provider, MD  co-enzyme Q-10 50 MG capsule Take 50 mg by mouth daily.   Yes Historical Provider, MD  GLUCOSAMINE-CHONDROITIN PO Take 1 tablet by mouth 2 (two) times daily.   Yes Historical Provider, MD  LORazepam (ATIVAN) 2 MG tablet TAKE 1/2 TO 1 TABLET AT BEDTIME FOR SLEEP.   Yes Barton Fanny, MD  Multiple Vitamins-Minerals (MULTIVITAMIN PO) Take by mouth daily.   Yes Historical Provider, MD  Omega-3 Fatty Acids (FISH OIL PO) Take by mouth 2 (two) times daily.   Yes Historical Provider, MD  pravastatin (PRAVACHOL) 20 MG tablet Take 1 tablet (20 mg total) by mouth daily.   Yes Barton Fanny, MD  sertraline (ZOLOFT) 50 MG tablet Take 1/2 -  1 tablet every AM for anxiety or as directed. 12/11/12  Yes Barton Fanny, MD  benzonatate (TESSALON) 100 MG capsule Take 1-2 capsules (100-200 mg total) by mouth 3 (three) times daily as needed for cough. 06/14/13   Posey Boyer, MD  HYDROcodone-homatropine [facility]) 5-1.5 MG/5ML syrup Take 1 teaspoon at night for cough 11/26/13   Darreld Mclean, MD   PMHx, Surg Hx, soc and Fam HX reviewed.  ROS: As per HPI.  O: Filed Vitals:   01/13/14 1403  BP: 126/70  Pulse: 91  Temp: 97.7 F (36.5 C)  Resp: 18    GEN: In NAD; WN,WD. HENT: [place]/AT; EOMI w/ clear conj/sclerae. Otherwise unremarkable. COR: RRR. LUNGS: Unlabored resp. SKIN: W&D; intact w/o erythema, pallor or jaundice. MS: FROM; no deformities or atrophy. NEURO: A&O x 3; CNs intact Nonfocal.  A/P: Other and unspecified hyperlipidemia - Plan: Lipid panel, COMPLETE METABOLIC PANEL WITH GFR  Persistent disorder of initiating or maintaining sleep - Continue Lorazepam prn.  RTC for CPE/ PAP in 3-4 months.  Meds ordered this encounter  Medications  . pravastatin (PRAVACHOL) 20 MG tablet    Sig: Take 1 tablet (20 mg total) by mouth daily.    Dispense:  30 tablet    Refill:  5  . LORazepam (ATIVAN) 2 MG tablet    Sig: TAKE 1/2 TO 1 TABLET AT BEDTIME FOR SLEEP.    Dispense:  30 tablet    Refill:  3

## 2014-01-14 LAB — THYROID PANEL WITH TSH
Free Thyroxine Index: 2.7 (ref 1.0–3.9)
T3 Uptake: 30 % (ref 22.5–37.0)
T4 TOTAL: 8.9 ug/dL (ref 5.0–12.5)
TSH: 1.936 u[IU]/mL (ref 0.350–4.500)

## 2014-01-16 NOTE — Progress Notes (Signed)
Quick Note:  Please advise pt regarding following labs...  Thyroid function tests are normal. Metabolic panel (sodium, potassium, calcium, blood sugar, kidney and liver function tests) is normal.  Cholesterol numbers are good; LDL ("bad") cholesterol is above 100 even though you are taking Pravastatin. Continue this medication and focus on improving your nutrition and staying active for weight loss. Reasonable weight loss is 1/2 to 1 pound per week.  If you need to see a dietitian for guidance, we can refer for that service.  Take care... B.McPherson, MD ______

## 2014-01-19 ENCOUNTER — Ambulatory Visit (HOSPITAL_COMMUNITY): Payer: Self-pay | Attending: Family Medicine

## 2014-01-29 ENCOUNTER — Other Ambulatory Visit: Payer: Self-pay | Admitting: Family Medicine

## 2014-02-24 ENCOUNTER — Ambulatory Visit (INDEPENDENT_AMBULATORY_CARE_PROVIDER_SITE_OTHER): Payer: No Typology Code available for payment source | Admitting: Family Medicine

## 2014-02-24 ENCOUNTER — Encounter: Payer: Self-pay | Admitting: Family Medicine

## 2014-02-24 VITALS — BP 147/76 | HR 83 | Temp 98.2°F | Resp 16 | Ht 61.5 in | Wt 219.0 lb

## 2014-02-24 DIAGNOSIS — H6123 Impacted cerumen, bilateral: Secondary | ICD-10-CM

## 2014-02-24 DIAGNOSIS — M79641 Pain in right hand: Secondary | ICD-10-CM

## 2014-02-24 DIAGNOSIS — M25549 Pain in joints of unspecified hand: Secondary | ICD-10-CM

## 2014-02-24 DIAGNOSIS — Z0000 Encounter for general adult medical examination without abnormal findings: Secondary | ICD-10-CM

## 2014-02-24 DIAGNOSIS — H6120 Impacted cerumen, unspecified ear: Secondary | ICD-10-CM

## 2014-02-24 DIAGNOSIS — E785 Hyperlipidemia, unspecified: Secondary | ICD-10-CM

## 2014-02-24 LAB — POCT URINALYSIS DIPSTICK
BILIRUBIN UA: NEGATIVE
Blood, UA: NEGATIVE
Glucose, UA: NEGATIVE
Ketones, UA: NEGATIVE
LEUKOCYTES UA: NEGATIVE
NITRITE UA: NEGATIVE
PROTEIN UA: NEGATIVE
Spec Grav, UA: 1.02
Urobilinogen, UA: 0.2
pH, UA: 5

## 2014-02-24 NOTE — Progress Notes (Signed)
Subjective:    Patient ID: Deanna Allen, female    DOB: August 21, 1950, 63 y.o.   MRN: [id number]  HPI  This 63 y.o. 39 female is here for CPE; she is not sure if she needs a PAP/pelvic exam. She is post-menopausal and is asymptomatic. Pt is compliant with medications; she does not really follow a meal plan for lipid reduction; exercise is limited due to arthritic knees and L ankle. She has arthritic changes in fingers w/ increasing pain in R hand.   HCM:  PAP- 2013 (negative).            MMG- Scheduled.            CRS- 2013 (5- year recall due to family hx of colon cancer).            IMM- Current.            Vision- Annually.  Patient Active Problem List   Diagnosis Date Noted  . Injury of left foot 12/11/2012  . Left ankle injury 12/11/2012  . Insomnia, persistent 04/25/2012  . History of anxiety disorder 04/25/2012  . Obesity, Class III, BMI 40-49.9 (morbid obesity) 04/25/2012  . Hyperlipidemia 04/25/2012    Outpatient Encounter Prescriptions as of 02/24/2014  Medication Sig Note  . aspirin 81 MG tablet Take 81 mg by mouth daily. 09/13/2012: PER PATIENT TAKE EVERY OTHER DAY  . calcium carbonate (OS-CAL) 600 MG TABS Take 600 mg by mouth 2 (two) times daily with a meal.   . Cholecalciferol (VITAMIN D PO) Take 1 tablet by mouth 2 (two) times daily.   Marland Kitchen co-enzyme Q-10 50 MG capsule Take 50 mg by mouth daily.   Marland Kitchen GLUCOSAMINE-CHONDROITIN PO Take 1 tablet by mouth 2 (two) times daily.   Marland Kitchen LORazepam (ATIVAN) 2 MG tablet TAKE 1/2 TO 1 TABLET AT BEDTIME FOR SLEEP.   . Omega-3 Fatty Acids (FISH OIL PO) Take by mouth 2 (two) times daily.   . pravastatin (PRAVACHOL) 20 MG tablet Take 1 tablet (20 mg total) by mouth daily.   . sertraline (ZOLOFT) 50 MG tablet take 1/2 to 1 tablet by mouth every morning for anxiety or as directed   . albuterol (PROVENTIL HFA;VENTOLIN HFA) 108 (90 BASE) MCG/ACT inhaler Inhale 2 puffs into the lungs every 4 (four) hours as needed for wheezing or shortness of  breath (cough, shortness of breath or wheezing.).   Marland Kitchen HYDROcodone-homatropine (HYCODAN) 5-1.5 MG/5ML syrup Take 1 teaspoon at night for cough   . Multiple Vitamins-Minerals (MULTIVITAMIN PO) Take by mouth daily.     Past Surgical History  Procedure Laterality Date  . Colonoscopy    . Knee surgery  2001    right    History   Social History  . Marital Status: Married    Spouse Name: N/A    Number of Children: N/A  . Years of Education: N/A   Occupational History  . Not on file.   Social History Main Topics  . Smoking status: Never Smoker   . Smokeless tobacco: Never Used  . Alcohol Use: Yes     Comment: 2-3 a month  . Drug Use: No  . Sexual Activity: Yes     Comment: number of sex partners in the last 52 months  1   Other Topics Concern  . Not on file   Social History Narrative  . No narrative on file    Review of Systems  Constitutional: Negative.   Eyes: Positive for itching and visual disturbance.  Respiratory: Negative.   Cardiovascular: Negative.   Gastrointestinal: Negative.   Musculoskeletal: Positive for arthralgias and joint swelling. Negative for back pain.  Skin: Negative.   Allergic/Immunologic: Positive for environmental allergies.  Neurological: Negative.   Hematological: Negative.   Psychiatric/Behavioral: Positive for sleep disturbance. Negative for suicidal ideas, behavioral problems, confusion, self-injury, dysphoric mood and decreased concentration. The patient is nervous/anxious. The patient is not hyperactive.        Chronic insomnia.       Objective:   Physical Exam  Nursing note and vitals reviewed. Constitutional: She is oriented to person, place, and time. Vital signs are normal. She appears well-developed and well-nourished. No distress.  HENT:  Head: Normocephalic and atraumatic.  Right Ear: External ear normal. Decreased hearing is noted.  Nose: Nose normal. No nasal deformity or septal deviation.  Mouth/Throat: Uvula is  midline, oropharynx is clear and moist and mucous membranes are normal. No oral lesions. Normal dentition.  Bilateral cerumen impaction- ears irrigated by this provider with significant soft cerumen dislodged.  Shyrl Numbers, CMA  placed Colace in canals prior to irrigation.  Eyes: Conjunctivae, EOM and lids are normal. Pupils are equal, round, and reactive to light. No scleral icterus.  Fundoscopic exam:      The right eye shows no papilledema. The right eye shows red reflex.       The left eye shows no papilledema. The left eye shows red reflex.  Neck: Trachea normal, normal range of motion and full passive range of motion without pain. Neck supple. No JVD present. No spinous process tenderness and no muscular tenderness present. Carotid bruit is not present. No tracheal deviation present. No mass and no thyromegaly present.  Cardiovascular: Normal rate, regular rhythm, S1 normal, S2 normal, intact distal pulses and normal pulses.   No extrasystoles are present. PMI is not displaced.  Exam reveals no gallop and no friction rub.   No murmur heard. Pulmonary/Chest: Effort normal and breath sounds normal. No respiratory distress. Right breast exhibits no inverted nipple, no mass, no nipple discharge, no skin change and no tenderness. Left breast exhibits no inverted nipple, no mass, no nipple discharge, no skin change and no tenderness. Breasts are symmetrical.  Abdominal: Soft. Normal appearance and bowel sounds are normal. She exhibits no distension, no abdominal bruit, no pulsatile midline mass and no mass. There is no hepatosplenomegaly. There is no tenderness. There is no guarding and no CVA tenderness.  Genitourinary:  Deferred.  Musculoskeletal:       Left shoulder: Normal.       Right knee: She exhibits decreased range of motion. She exhibits no swelling, no effusion, no deformity and normal alignment. Tenderness found.       Left knee: She exhibits decreased range of motion and deformity. She  exhibits no swelling, no effusion and normal alignment. No tenderness found.       Left ankle: She exhibits decreased range of motion and deformity. She exhibits no swelling and no ecchymosis. No tenderness.       Cervical back: Normal.       Thoracic back: Normal.       Lumbar back: Normal.       Right hand: She exhibits tenderness and deformity. Normal sensation noted. Normal strength noted.       Left hand: She exhibits deformity. Normal sensation noted. Normal strength noted.  Heberden's nodes at DIP joints on index and middle fingers.  Lymphadenopathy:       Head (right side): No  submental, no submandibular, no tonsillar, no preauricular, no posterior auricular and no occipital adenopathy present.       Head (left side): No submental, no submandibular, no tonsillar, no preauricular, no posterior auricular and no occipital adenopathy present.    She has no cervical adenopathy.    She has no axillary adenopathy.       Right: No inguinal and no supraclavicular adenopathy present.       Left: No inguinal and no supraclavicular adenopathy present.  Neurological: She is alert and oriented to person, place, and time. She has normal strength and normal reflexes. She displays no atrophy and no tremor. No cranial nerve deficit or sensory deficit. She exhibits normal muscle tone. Coordination and gait normal.  Skin: Skin is warm, dry and intact. No ecchymosis and no rash noted. She is not diaphoretic. No cyanosis or erythema. No pallor. Nails show no clubbing.  Psychiatric: She has a normal mood and affect. Her speech is normal and behavior is normal. Judgment and thought content normal. Cognition and memory are normal.    Results for orders placed in visit on 02/24/14  POCT URINALYSIS DIPSTICK      Result Value Ref Range   Color, UA yellow     Clarity, UA clear     Glucose, UA neg     Bilirubin, UA neg     Ketones, UA neg     Spec Grav, UA 1.020     Blood, UA neg     pH, UA 5.0     Protein,  UA neg     Urobilinogen, UA 0.2     Nitrite, UA neg     Leukocytes, UA Negative        Assessment & Plan:  Routine general medical examination at a health care facility - Plan: POCT urinalysis dipstick  Cerumen impaction, bilateral- Successful irrigation of ear canals.  Hand joint pain, right - Plan: Ambulatory referral to Hand Surgery  Hyperlipidemia- Continue current medications. Mediterranean Diet/guidelines given to pt.  Encouraged increased activity (aquatic exercise- recommended pt look into classes at the [facility]).

## 2014-02-24 NOTE — Patient Instructions (Addendum)
Keeping You Healthy  Get These Tests  Blood Pressure- Have your blood pressure checked by your healthcare provider at least once a year.  Normal blood pressure is 120/80.  Weight- Have your body mass index (BMI) calculated to screen for obesity.  BMI is a measure of body fat based on height and weight.  You can calculate your own BMI at [employer]  Cholesterol- Have your cholesterol checked every year.  Diabetes- Have your blood sugar checked every year if you have high blood pressure, high cholesterol, a family history of diabetes or if you are overweight.  Pap Smear- Have a pap smear every 1 to 3 years if you have been sexually active.  If you are older than 65 and recent pap smears have been normal you may not need additional pap smears.  In addition, if you have had a hysterectomy  For benign disease additional pap smears are not necessary.  Mammogram-Yearly mammograms are essential for early detection of breast cancer  Screening for Colon Cancer- Colonoscopy starting at age 19. Screening may begin sooner depending on your family history and other health conditions.  Follow up colonoscopy as directed by your Gastroenterologist.  Screening for Osteoporosis- Screening begins at age 53 with bone density scanning, sooner if you are at higher risk for developing Osteoporosis.  Get these medicines  Calcium with Vitamin D- Your body requires 1200-1500 mg of Calcium a day and [phone] IU of Vitamin D a day.  You can only absorb 500 mg of Calcium at a time therefore Calcium must be taken in 2 or 3 separate doses throughout the day.  Hormones- Hormone therapy has been associated with increased risk for certain cancers and heart disease.  Talk to your healthcare provider about if you need relief from menopausal symptoms.  Aspirin- Ask your healthcare provider about taking Aspirin to prevent Heart Disease and Stroke.  Get these Immuniztions  Flu shot- Every fall. Return in  October or November for your Flu vaccine.  Pneumonia shot- Once after the age of 85; if you are younger ask your healthcare provider if you need a pneumonia shot.  Tetanus- Every ten years. Tdap given in 2012; next Tetanus due in 2022.  Zostavax- Once after the age of 29 to prevent shingles. You received thia vaccine in 2013.  Take these steps  Don't smoke- Your healthcare provider can help you quit. For tips on how to quit, ask your healthcare provider or go to www.smokefree.gov or call 1-800 QUIT-NOW.  Be physically active- Exercise 5 days a week for a minimum of 30 minutes.  If you are not already physically active, start slow and gradually work up to 30 minutes of moderate physical activity.  Try walking, dancing, bike riding, swimming, etc.  Eat a healthy diet- Eat a variety of healthy foods such as fruits, vegetables, whole grains, low fat milk, low fat cheeses, yogurt, lean meats, chicken, fish, eggs, dried beans, tofu, etc.  For more information go to www.thenutritionsource.org  Dental visit- Brush and floss teeth twice daily; visit your dentist twice a year.  Eye exam- Visit your Optometrist or Ophthalmologist yearly.  Drink alcohol in moderation- Limit alcohol intake to one drink or less a day.  Never drink and drive.  Depression- Your emotional health is as important as your physical health.  If you're feeling down or losing interest in things you normally enjoy, please talk to your healthcare provider.  Seat Belts- can save your life; always wear one  Smoke/Carbon Monoxide detectors- These  detectors need to be installed on the appropriate level of your home.  Replace batteries at least once a year.  Violence- If anyone is threatening or hurting you, please tell your healthcare provider.  Living Will/ Health care power of attorney- Discuss with your healthcare provider and family.        Mediterranean Diet  Why follow it? Research shows.   Those who follow the  Mediterranean diet have a reduced risk of heart disease    The diet is associated with a reduced incidence of Parkinson's and Alzheimer's diseases   People following the diet may have longer life expectancies and lower rates of chronic diseases    The Dietary Guidelines for Americans recommends the Mediterranean diet as an eating plan to promote health and prevent disease  What Is the Mediterranean Diet?    Healthy eating plan based on typical foods and recipes of Mediterranean-style cooking   The diet is primarily a plant based diet; these foods should make up a majority of meals   Starches - Plant based foods should make up a majority of meals - They are an important sources of vitamins, minerals, energy, antioxidants, and fiber - Choose whole grains, foods high in fiber and minimally processed items  - Typical grain sources include wheat, oats, barley, corn, brown rice, bulgar, farro, millet, polenta, couscous  - Various types of beans include chickpeas, lentils, fava beans, black beans, white beans   Fruits  Veggies - Large quantities of antioxidant rich fruits & veggies; 6 or more servings  - Vegetables can be eaten raw or lightly drizzled with oil and cooked  - Vegetables common to the traditional Mediterranean Diet include: artichokes, arugula, beets, broccoli, brussel sprouts, cabbage, carrots, celery, collard greens, cucumbers, eggplant, kale, leeks, lemons, lettuce, mushrooms, okra, onions, peas, peppers, potatoes, pumpkin, radishes, rutabaga, shallots, spinach, sweet potatoes, turnips, zucchini - Fruits common to the Mediterranean Diet include: apples, apricots, avocados, cherries, clementines, dates, figs, grapefruits, grapes, melons, nectarines, oranges, peaches, pears, pomegranates, strawberries, tangerines  Fats - Replace butter and margarine with healthy oils, such as olive oil, canola oil, and tahini  - Limit nuts to no more than a handful a day  - Nuts include walnuts, almonds,  pecans, pistachios, pine nuts  - Limit or avoid candied, honey roasted or heavily salted nuts - Olives are central to the [employer] - can be eaten whole or used in a variety of dishes   Meats Protein - Limiting red meat: no more than a few times a month - When eating red meat: choose lean cuts and keep the portion to the size of deck of cards - Eggs: approx. 0 to 4 times a week  - Fish and lean poultry: at least 2 a week  - Healthy protein sources include, chicken, [place], lean beef, lamb - Increase intake of seafood such as tuna, salmon, trout, mackerel, shrimp, scallops - Avoid or limit high fat processed meats such as sausage and bacon  Dairy - Include moderate amounts of low fat dairy products  - Focus on healthy dairy such as fat free yogurt, skim milk, low or reduced fat cheese - Limit dairy products higher in fat such as whole or 2% milk, cheese, ice cream  Alcohol - Moderate amounts of red wine is ok  - No more than 5 oz daily for women (all ages) and men older than age 79  - No more than 10 oz of wine daily for men younger than 48  Other -  Limit sweets and other desserts  - Use herbs and spices instead of salt to flavor foods  - Herbs and spices common to the traditional Mediterranean Diet include: basil, bay leaves, chives, cloves, cumin, fennel, garlic, lavender, marjoram, mint, oregano, parsley, pepper, rosemary, sage, savory, sumac, tarragon, thyme   It's not just a diet, it's a lifestyle:    The Mediterranean diet includes lifestyle factors typical of those in the region    Foods, drinks and meals are best eaten with others and savored   Daily physical activity is important for overall good health   This could be strenuous exercise like running and aerobics   This could also be more leisurely activities such as walking, housework, yard-work, or taking the stairs   Moderation is the key; a balanced and healthy diet accommodates most foods and drinks   Consider portion  sizes and frequency of consumption of certain foods   Meal Ideas & Options:    Breakfast:  o Whole wheat toast or whole wheat English muffins with peanut butter & hard boiled egg o Steel cut oats topped with apples & cinnamon and skim milk  o Fresh fruit: banana, strawberries, melon, berries, peaches  o Smoothies: strawberries, bananas, greek yogurt, peanut butter o Low fat greek yogurt with blueberries and granola  o Egg white omelet with spinach and mushrooms o Breakfast couscous: whole wheat couscous, apricots, skim milk, cranberries    Sandwiches:  o Hummus and grilled vegetables (peppers, zucchini, squash) on whole wheat bread   o Grilled chicken on whole wheat pita with lettuce, tomatoes, cucumbers or tzatziki  o Tuna salad on whole wheat bread: tuna salad made with greek yogurt, olives, red peppers, capers, green onions o Garlic rosemary lamb pita: lamb sauted with garlic, rosemary, salt & pepper; add lettuce, cucumber, greek yogurt to pita - flavor with lemon juice and black pepper    Seafood:  o Mediterranean grilled salmon, seasoned with garlic, basil, parsley, lemon juice and black pepper o Shrimp, lemon, and spinach whole-grain pasta salad made with low fat greek yogurt  o Seared scallops with lemon orzo  o Seared tuna steaks seasoned salt, pepper, coriander topped with tomato mixture of olives, tomatoes, olive oil, minced garlic, parsley, green onions and cappers    Meats:  o Herbed greek chicken salad with kalamata olives, cucumber, feta  o Red bell peppers stuffed with spinach, bulgur, lean ground beef (or lentils) & topped with feta   o Kebabs: skewers of chicken, tomatoes, onions, zucchini, squash  o [place] burgers: made with red onions, mint, dill, lemon juice, feta cheese topped with roasted red peppers   Vegetarian o Cucumber salad: cucumbers, artichoke hearts, celery, red onion, feta cheese, tossed in olive oil & lemon juice  o Hummus and whole grain pita points with  a greek salad (lettuce, tomato, feta, olives, cucumbers, red onion) o Lentil soup with celery, carrots made with vegetable broth, garlic, salt and pepper  o Tabouli salad: parsley, bulgur, mint, scallions, cucumbers, tomato, radishes, lemon juice, olive oil, salt and pepper.

## 2014-02-25 LAB — IFOBT (OCCULT BLOOD): IFOBT: NEGATIVE

## 2014-02-25 NOTE — Addendum Note (Signed)
Addended by: Yvette Rack on: 02/25/2014 03:42 PM   Modules accepted: Orders

## 2014-02-26 NOTE — Progress Notes (Signed)
Quick Note:  Please advise pt regarding following labs... The stool test she returned is negative for blood. ______

## 2014-04-28 ENCOUNTER — Other Ambulatory Visit: Payer: Self-pay | Admitting: Family Medicine

## 2014-04-28 ENCOUNTER — Ambulatory Visit (HOSPITAL_COMMUNITY)
Admission: RE | Admit: 2014-04-28 | Discharge: 2014-04-28 | Disposition: A | Discharge status: Home / Self Care | Payer: No Typology Code available for payment source | Source: Ambulatory Visit | Attending: Family Medicine | Admitting: Family Medicine

## 2014-04-28 DIAGNOSIS — Z1231 Encounter for screening mammogram for malignant neoplasm of breast: Secondary | ICD-10-CM | POA: Diagnosis not present

## 2014-06-30 ENCOUNTER — Other Ambulatory Visit: Payer: Self-pay | Admitting: Family Medicine

## 2014-07-01 NOTE — Telephone Encounter (Signed)
Lorazepam refill phoned to pt's pharmacy. 

## 2014-07-28 ENCOUNTER — Other Ambulatory Visit: Payer: Self-pay | Admitting: Family Medicine

## 2014-09-09 ENCOUNTER — Other Ambulatory Visit: Payer: Self-pay | Admitting: Family Medicine

## 2014-10-12 ENCOUNTER — Other Ambulatory Visit: Payer: Self-pay | Admitting: Family Medicine

## 2014-10-14 NOTE — Telephone Encounter (Signed)
Lorazepam refilled w/ notification that OV needs to be scheduled.

## 2014-10-14 NOTE — Telephone Encounter (Signed)
I sent in 2 wks of pravastatin per protocol since we have already given pt notice on last RF that OV is needed. Do you want to RF this?

## 2014-11-12 ENCOUNTER — Telehealth: Payer: Self-pay

## 2014-11-12 MED ORDER — LORAZEPAM 2 MG PO TABS: ORAL_TABLET | ORAL | Status: DC

## 2014-11-12 MED ORDER — PRAVASTATIN SODIUM 20 MG PO TABS: 20.0000 mg | ORAL_TABLET | Freq: Every day | ORAL | Status: DC

## 2014-11-12 NOTE — Telephone Encounter (Signed)
Refills authorized for pravastatin and lorazepam until pt can be seen in August. No additional refills on these meds w/o OV.

## 2014-11-12 NOTE — Telephone Encounter (Signed)
Patient is calling because she was only given a 15 day supply of provastatin and wants to know if she is due for blood work in order to get more refills. Patient states that she can't come in for another office visit until August and would like to get another refill without an office visit if possible. Please call and advise! [phone]

## 2014-11-12 NOTE — Telephone Encounter (Signed)
Dr Leward Quan, you had put a notice on last Lorazepam Rx also that pt needs OV. Please review pt message and advise.

## 2014-11-13 NOTE — Telephone Encounter (Signed)
lmom for patient to pick medicine up from pharmacy

## 2015-01-21 IMAGING — CR DG ANKLE 2V *L*
1 series · 1 of 1 positions shown · non-contrast
Comparison: Left foot series performed today.

***ADDENDUM*** CREATED: 10/14/2012 [DATE]

I suspect there is likely a nondisplaced fracture at the tip of the
lateral malleolus.  This is best seen on the AP view of the foot.
There is suggestion of this fracture on the oblique view of the
ankle.
***END ADDENDUM*** SIGNED BY: Comfort Siliezar, M.D.
CLINICAL DATA: Fall, ankle and foot pain.
LEFT ANKLE - 2 VIEW

[AP]
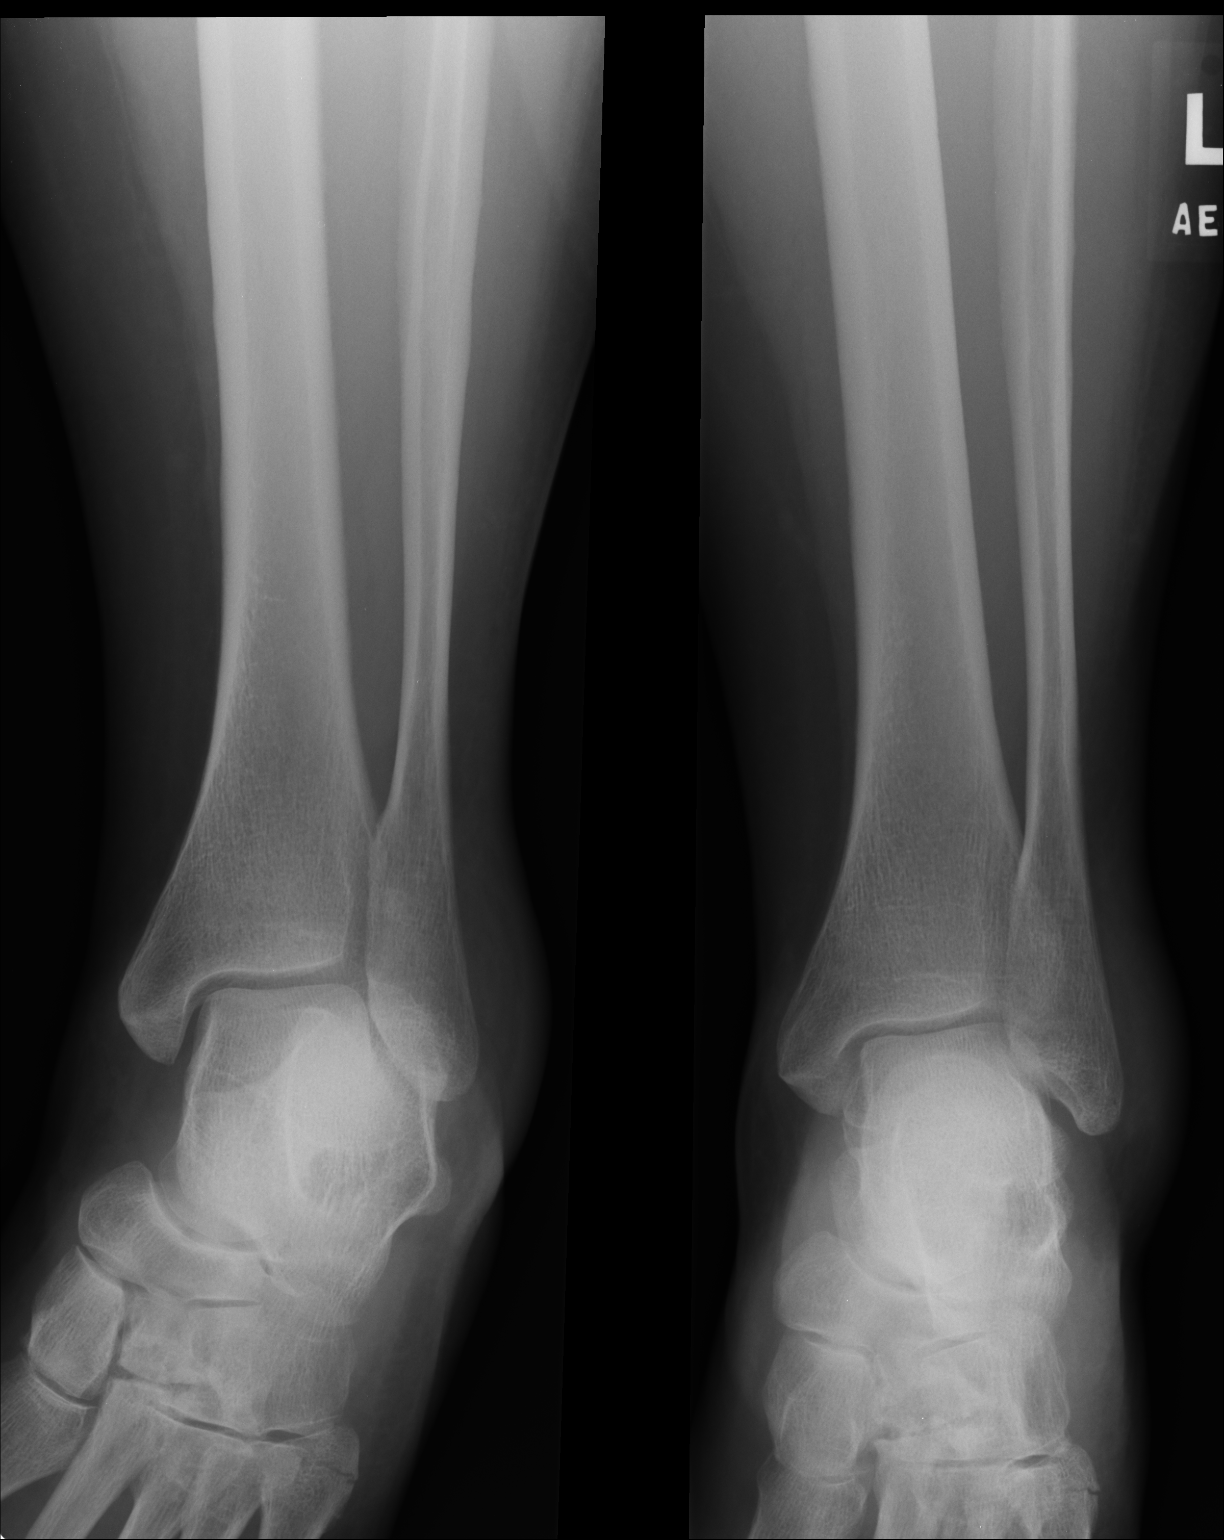

[1 of 1 positions shown; findings below may reference images not displayed]

FINDINGS: There is a nondisplaced fracture through the base of the
left fifth metatarsal.  Lateral soft tissue swelling over the left
ankle.  No underlying acute bony abnormality at the ankle.
Degenerative changes at the tarsometatarsal joints.
IMPRESSION: Nondisplaced fracture through the base of the left fifth
metatarsal.

## 2015-01-30 IMAGING — CR DG FOOT COMPLETE 3+V*L*
3 series · 3 of 3 positions shown · non-contrast
Comparison: 10/14/2012

CLINICAL DATA: Foot pain.

LEFT FOOT - COMPLETE 3+ VIEW

[AP]
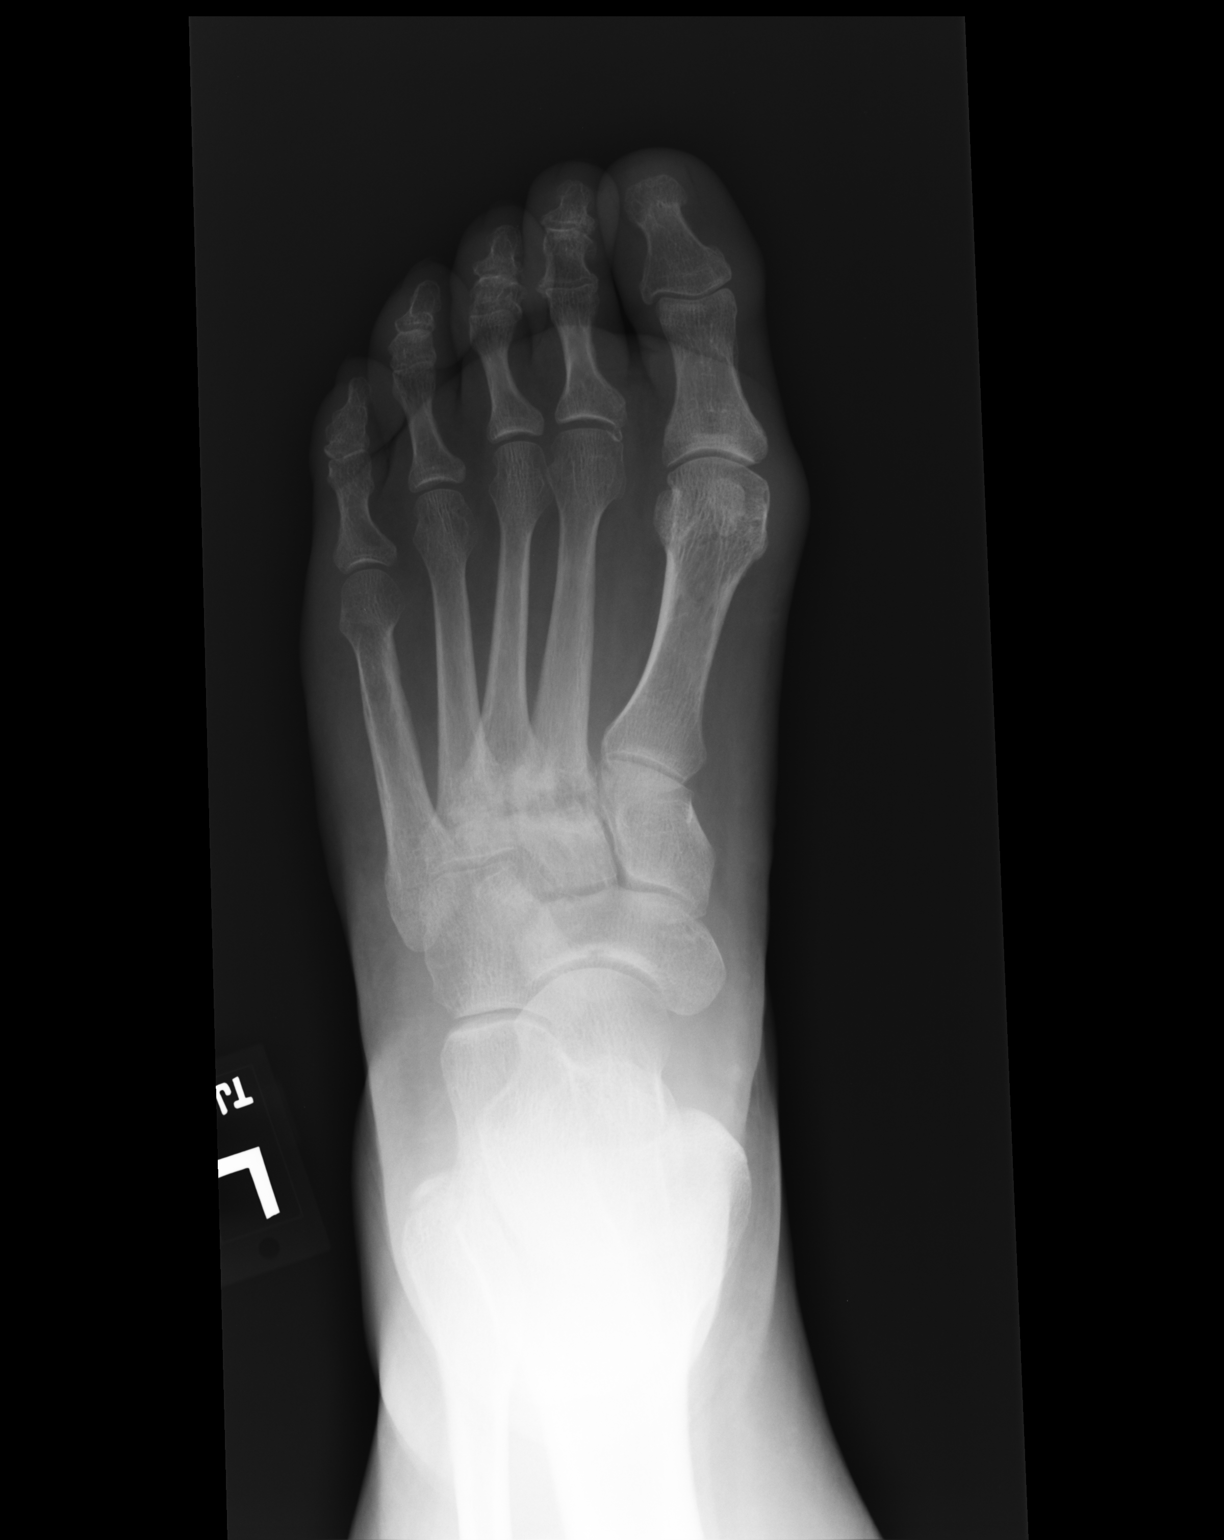

[ap obl int rot]
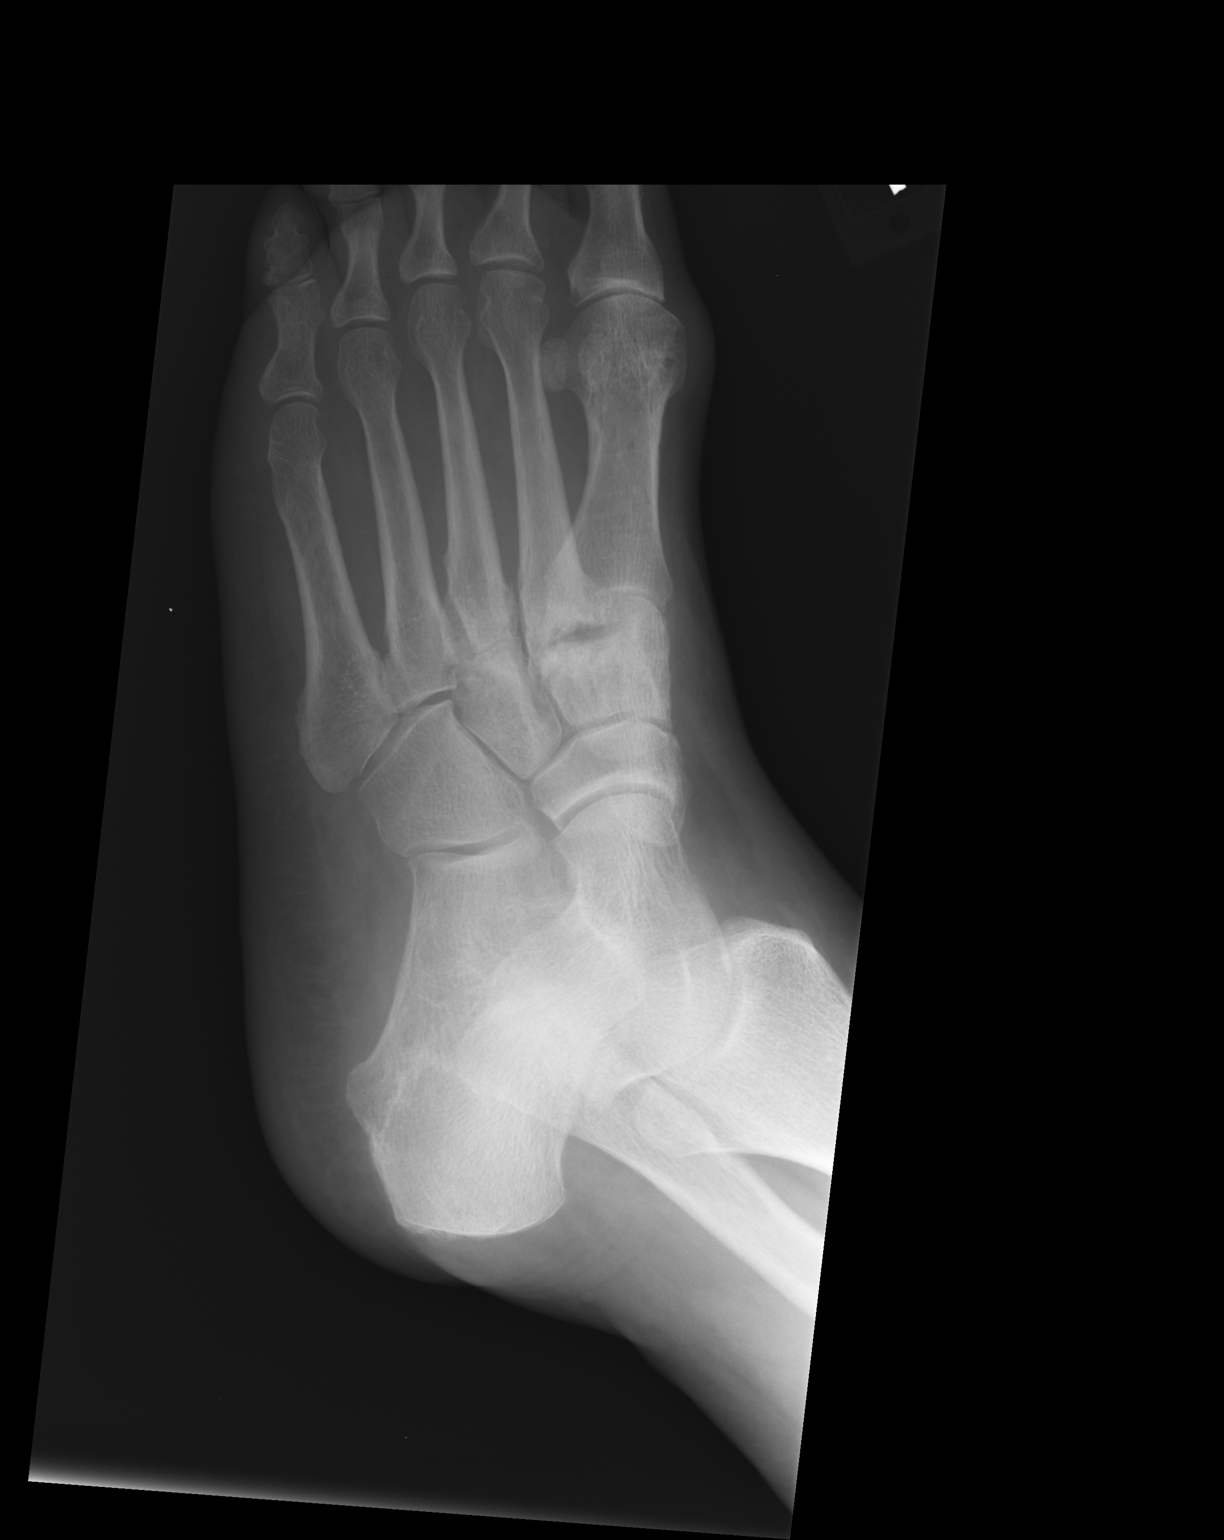

[lateral]
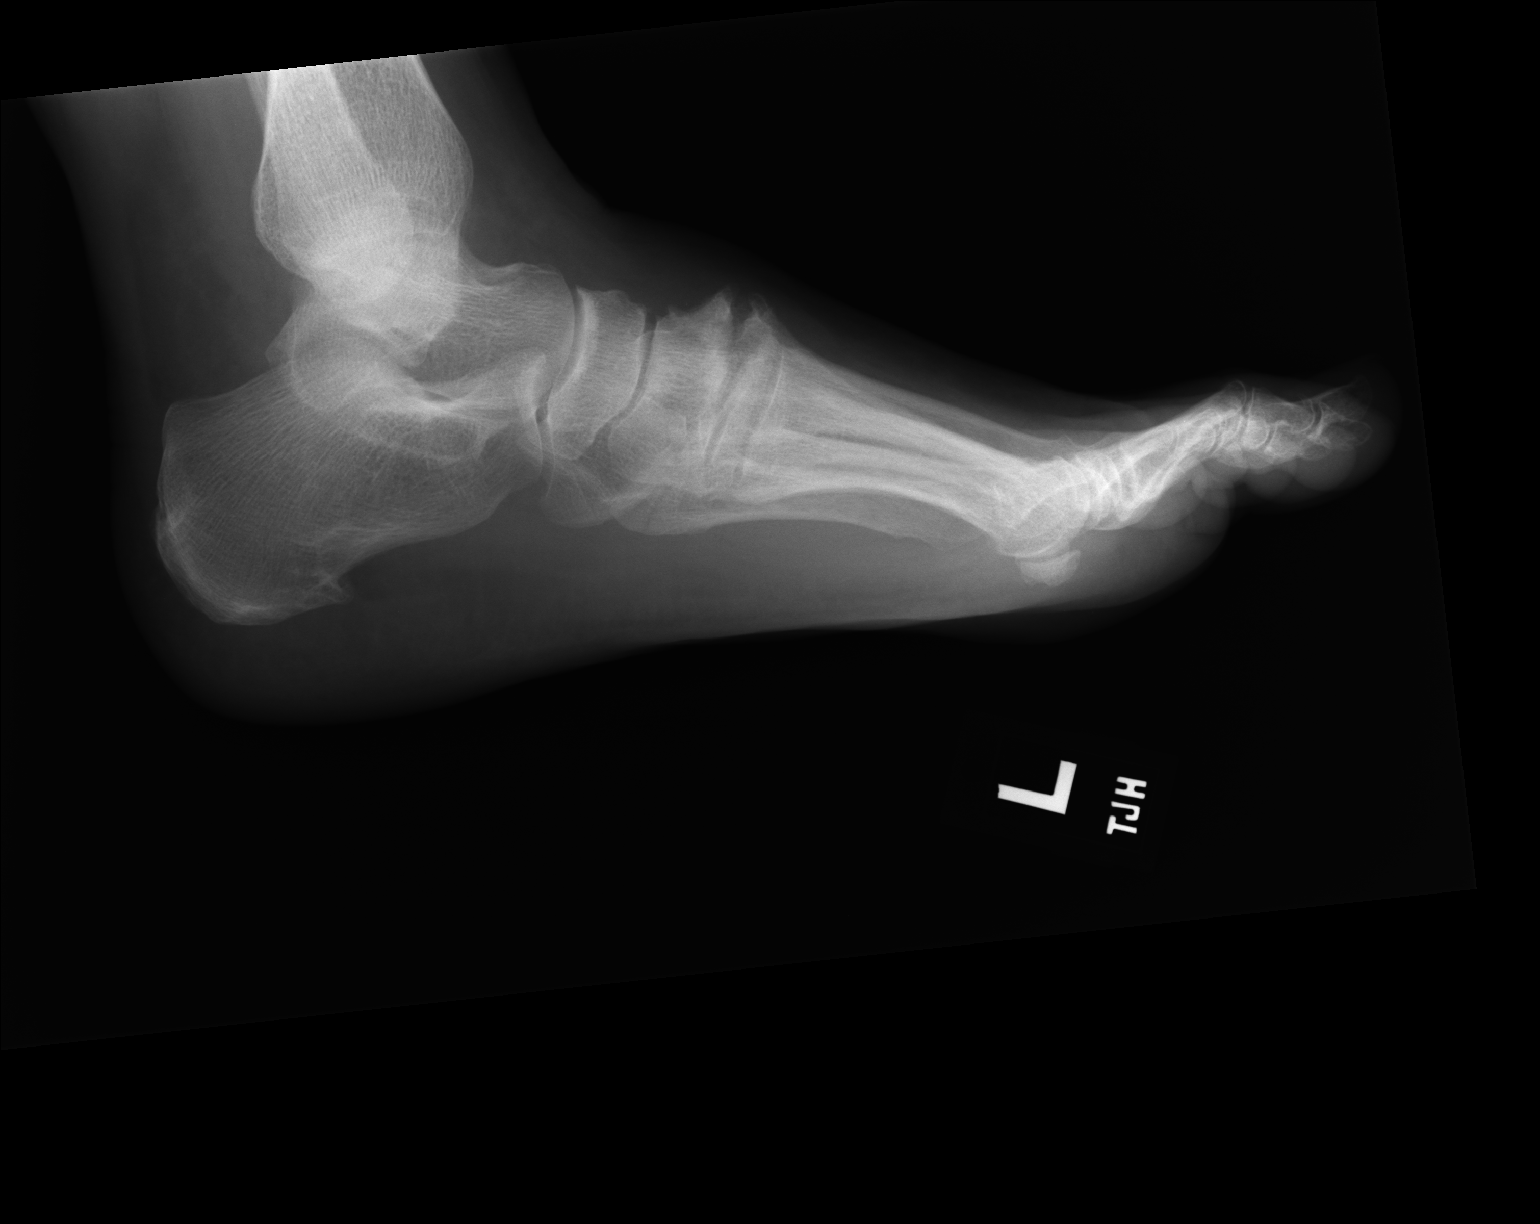

[3 of 3 positions shown; findings below may reference images not displayed]

FINDINGS: Nondisplaced fracture through the base of the left fifth
metatarsal again noted, unchanged.  Suspicion for fracture through
the tip of the lateral malleolus.  Degenerative changes at the
first tarsometatarsal joint.
IMPRESSION: Stable nondisplaced fracture through the base of the left fifth
metatarsal.  Suspicion for lateral malleolar fracture.

## 2015-03-16 ENCOUNTER — Encounter: Payer: Self-pay | Admitting: Family Medicine

## 2015-03-16 ENCOUNTER — Ambulatory Visit (INDEPENDENT_AMBULATORY_CARE_PROVIDER_SITE_OTHER): Payer: Medicare HMO | Admitting: Family Medicine

## 2015-03-16 VITALS — BP 142/84 | HR 75 | Temp 97.7°F | Resp 16 | Ht 62.0 in | Wt 225.8 lb

## 2015-03-16 DIAGNOSIS — J309 Allergic rhinitis, unspecified: Secondary | ICD-10-CM | POA: Diagnosis not present

## 2015-03-16 DIAGNOSIS — H6123 Impacted cerumen, bilateral: Secondary | ICD-10-CM | POA: Diagnosis not present

## 2015-03-16 DIAGNOSIS — Z139 Encounter for screening, unspecified: Secondary | ICD-10-CM | POA: Diagnosis not present

## 2015-03-16 DIAGNOSIS — G4700 Insomnia, unspecified: Secondary | ICD-10-CM | POA: Diagnosis not present

## 2015-03-16 DIAGNOSIS — F419 Anxiety disorder, unspecified: Secondary | ICD-10-CM | POA: Diagnosis not present

## 2015-03-16 DIAGNOSIS — E785 Hyperlipidemia, unspecified: Secondary | ICD-10-CM | POA: Diagnosis not present

## 2015-03-16 DIAGNOSIS — Z0000 Encounter for general adult medical examination without abnormal findings: Secondary | ICD-10-CM

## 2015-03-16 DIAGNOSIS — Z1389 Encounter for screening for other disorder: Secondary | ICD-10-CM

## 2015-03-16 LAB — CBC
HEMATOCRIT: 44.5 % (ref 36.0–46.0)
Hemoglobin: 14.7 g/dL (ref 12.0–15.0)
MCH: 30.3 pg (ref 26.0–34.0)
MCHC: 33 g/dL (ref 30.0–36.0)
MCV: 91.8 fL (ref 78.0–100.0)
MPV: 8.9 fL (ref 8.6–12.4)
Platelets: 210 10*3/uL (ref 150–400)
RBC: 4.85 MIL/uL (ref 3.87–5.11)
RDW: 13.6 % (ref 11.5–15.5)
WBC: 5.6 10*3/uL (ref 4.0–10.5)

## 2015-03-16 LAB — POCT URINALYSIS DIPSTICK
BILIRUBIN UA: NEGATIVE
Glucose, UA: NEGATIVE
Ketones, UA: NEGATIVE
NITRITE UA: NEGATIVE
Protein, UA: NEGATIVE
RBC UA: NEGATIVE
Spec Grav, UA: 1.02
Urobilinogen, UA: 0.2
pH, UA: 6

## 2015-03-16 LAB — COMPREHENSIVE METABOLIC PANEL
ALT: 16 U/L (ref 6–29)
AST: 19 U/L (ref 10–35)
Albumin: 4.2 g/dL (ref 3.6–5.1)
Alkaline Phosphatase: 109 U/L (ref 33–130)
BUN: 19 mg/dL (ref 7–25)
CALCIUM: 9.4 mg/dL (ref 8.6–10.4)
CO2: 28 mmol/L (ref 20–31)
Chloride: 105 mmol/L (ref 98–110)
Creat: 0.95 mg/dL (ref 0.50–0.99)
Glucose, Bld: 87 mg/dL (ref 65–99)
POTASSIUM: 4.5 mmol/L (ref 3.5–5.3)
Sodium: 142 mmol/L (ref 135–146)
TOTAL PROTEIN: 6.7 g/dL (ref 6.1–8.1)
Total Bilirubin: 0.7 mg/dL (ref 0.2–1.2)

## 2015-03-16 LAB — HEMOGLOBIN A1C
Hgb A1c MFr Bld: 5.8 % — ABNORMAL HIGH (ref <5.7)
Mean Plasma Glucose: 120 mg/dL — ABNORMAL HIGH (ref <117)

## 2015-03-16 LAB — LIPID PANEL
CHOL/HDL RATIO: 4.9 ratio (ref <=5.0)
CHOLESTEROL: 193 mg/dL (ref 125–200)
HDL: 39 mg/dL — AB (ref >=46)
LDL Cholesterol: 125 mg/dL (ref <130)
Triglycerides: 143 mg/dL (ref <150)
VLDL: 29 mg/dL (ref <30)

## 2015-03-16 LAB — TSH: TSH: 2.665 u[IU]/mL (ref 0.350–4.500)

## 2015-03-16 MED ORDER — PRAVASTATIN SODIUM 20 MG PO TABS: 20.0000 mg | ORAL_TABLET | Freq: Every day | ORAL | Status: DC

## 2015-03-16 MED ORDER — FLUTICASONE PROPIONATE 50 MCG/ACT NA SUSP: 2.0000 | Freq: Every day | NASAL | Status: DC

## 2015-03-16 MED ORDER — SERTRALINE HCL 25 MG PO TABS: 25.0000 mg | ORAL_TABLET | Freq: Every day | ORAL | Status: DC

## 2015-03-16 MED ORDER — LORAZEPAM 2 MG PO TABS: ORAL_TABLET | ORAL | Status: DC

## 2015-03-16 NOTE — Progress Notes (Signed)
Subjective:    Patient ID: Deanna Allen, female    DOB: 1950-10-25, 64 y.o.   MRN: [id number]  HPI This is a very pleasant 64 yo female who presents today for CPE. She is retired. She and her husband live apart [place]. He has parkinson's and according the patient, has become antisocial as his condition has worsened. She has a son who is getting married this fall and another son who lives in [place] with his wife and children. The patient spends a great deal of time with her grandchildren and is looking forward to her son's wedding.   Last CPE- 2015 Mammo- 04/2014 Pap- 2 years ago, never abnormal Colonoscopy- 4/13 Tdap- 9/12 Flu- gets at [facility]- biannual Exercise- gym several times a week, walks 5x/ week  Review of Systems  Constitutional: Negative.   HENT: Positive for ear pain (Has frequent problem with cerumen impaction, Uses OTC softening agent with little relief. ).   Eyes: Positive for photophobia and itching.  Respiratory: Negative.   Cardiovascular: Negative.   Gastrointestinal: Negative.   Endocrine: Negative.   Genitourinary: Negative.   Musculoskeletal: Positive for arthralgias.  Skin: Negative.   Allergic/Immunologic: Positive for environmental allergies.  Neurological: Negative.   Hematological: Negative.   Psychiatric/Behavioral: Positive for sleep disturbance (has chronic difficulty getting and staying asleep.).      Objective:   Physical Exam Physical Exam  Constitutional: She is oriented to person, place, and time. She appears well-developed and well-nourished. No distress.  HENT:  Head: Normocephalic and atraumatic.  Right Ear: External ear normal. Difficult to straighten ear canal to visualize. Cerumen impaction.  Left Ear: External ear normal. Cerumen impaction.  Nose: Nose normal.  Mouth/Throat: Oropharynx is clear and moist. No oropharyngeal exudate.  Eyes: Conjunctivae are normal. Pupils  are equal, round, and reactive to light.  Neck: Normal range of motion. Neck supple. No JVD present. No thyromegaly present.  Cardiovascular: Normal rate, regular rhythm, normal heart sounds and intact distal pulses.   Pulmonary/Chest: Effort normal and breath sounds normal. Right breast exhibits no inverted nipple, no mass, no nipple discharge, no skin change and no tenderness. Left breast exhibits no inverted nipple, no mass, no nipple discharge, no skin change and no tenderness. Breasts are symmetrical.  Abdominal: Soft. Bowel sounds are normal. She exhibits no distension and no mass. There is no tenderness. There is no rebound and no guarding.  Musculoskeletal: Normal range of motion. She exhibits no edema or tenderness.  Lymphadenopathy:    She has no cervical adenopathy.  Neurological: She is alert and oriented to person, place, and time. She has normal reflexes.  Skin: Skin is warm and dry. She is not diaphoretic.  Psychiatric: She has a normal mood and affect. Her behavior is normal. Judgment and thought content normal.  Vitals reviewed.  BP 142/84 mmHg  Pulse 75  Temp(Src) 97.7 F (36.5 C) (Oral)  Resp 16  Ht 5\' 2"  (1.575 m)  Wt 225 lb 12.8 oz (102.422 kg)  BMI 41.29 kg/m2  SpO2 98%    Wt Readings from Last 3 Encounters:  03/16/15 225 lb 12.8 oz (102.422 kg)  02/24/14 219 lb (99.338 kg)  01/13/14 215 lb (97.523 kg)    Assessment & Plan:  1. Annual physical exam  2. Allergic rhinitis, unspecified allergic rhinitis type - fluticasone (FLONASE) 50 MCG/ACT nasal spray; Place 2 sprays into both nostrils daily.  Dispense: 16 g; Refill: 6  3.  Cerumen impaction, bilateral - Ears irrigated bilaterally after instillation of colace. Left ear cleared. Right ear partially cleared. Provided instructions for mineral oil use several times a week, no cotton swabs. Follow up for further irrigation as necessary.   4. Persistent disorder of initiating or maintaining sleep - discussed  potential for dependence and decreased effectiveness with continued use. Encouraged her to use the smallest amount possible to obtain desired results - LORazepam (ATIVAN) 2 MG tablet; Take 1/2 to 1 tablet at bedtime for sleep.  Dispense: 30 tablet; Refill: 2  5. Anxiety - sertraline (ZOLOFT) 25 MG tablet; Take 1 tablet (25 mg total) by mouth daily.  Dispense: 90 tablet; Refill: 1 - LORazepam (ATIVAN) 2 MG tablet; Take 1/2 to 1 tablet at bedtime for sleep.  Dispense: 30 tablet; Refill: 2 - encouraged continued exercise, good sleep habits, suggested yoga/guided meditation  6. Hyperlipidemia - pravastatin (PRAVACHOL) 20 MG tablet; Take 1 tablet (20 mg total) by mouth daily.  Dispense: 90 tablet; Refill: 3 - CBC - Comprehensive metabolic panel - Lipid panel - TSH - Hemoglobin A1c  7. Severe obesity (BMI >= 40) - CBC - Comprehensive metabolic panel - encouraged healthy eating and continued regular exercise  8. Screening for hematuria or proteinuria - POCT urinalysis dipstick   Clarene Reamer, FNP-BC  [facility]  03/16/2015 10:47 AM

## 2015-03-16 NOTE — Patient Instructions (Signed)
Try long acting antihistamine such as zyrtec, allegra or claritin. Generic is fine. Take daily when the leaves start to dry up.   Allergic Rhinitis Allergic rhinitis is when the mucous membranes in the nose respond to allergens. Allergens are particles in the air that cause your body to have an allergic reaction. This causes you to release allergic antibodies. Through a chain of events, these eventually cause you to release histamine into the blood stream. Although meant to protect the body, it is this release of histamine that causes your discomfort, such as frequent sneezing, congestion, and an itchy, runny nose.  CAUSES  Seasonal allergic rhinitis (hay fever) is caused by pollen allergens that may come from grasses, trees, and weeds. Year-round allergic rhinitis (perennial allergic rhinitis) is caused by allergens such as house dust mites, pet dander, and mold spores.  SYMPTOMS   Nasal stuffiness (congestion).  Itchy, runny nose with sneezing and tearing of the eyes. DIAGNOSIS  Your health care provider can help you determine the allergen or allergens that trigger your symptoms. If you and your health care provider are unable to determine the allergen, skin or blood testing may be used. TREATMENT  Allergic rhinitis does not have a cure, but it can be controlled by:  Medicines and allergy shots (immunotherapy).  Avoiding the allergen. Hay fever may often be treated with antihistamines in pill or nasal spray forms. Antihistamines block the effects of histamine. There are over-the-counter medicines that may help with nasal congestion and swelling around the eyes. Check with your health care provider before taking or giving this medicine.  If avoiding the allergen or the medicine prescribed do not work, there are many new medicines your health care provider can prescribe. Stronger medicine may be used if initial measures are ineffective. Desensitizing injections can be used if medicine and  avoidance does not work. Desensitization is when a patient is given ongoing shots until the body becomes less sensitive to the allergen. Make sure you follow up with your health care provider if problems continue. HOME CARE INSTRUCTIONS It is not possible to completely avoid allergens, but you can reduce your symptoms by taking steps to limit your exposure to them. It helps to know exactly what you are allergic to so that you can avoid your specific triggers. SEEK MEDICAL CARE IF:   You have a fever.  You develop a cough that does not stop easily (persistent).  You have shortness of breath.  You start wheezing.  Symptoms interfere with normal daily activities. Document Released: 04/04/2001 Document Revised: 07/15/2013 Document Reviewed: 03/17/2013 [facility] Patient Information 2015 [place], [place]. This information is not intended to replace advice given to you by your health care provider. Make sure you discuss any questions you have with your health care provider.

## 2015-03-18 ENCOUNTER — Encounter: Payer: Self-pay | Admitting: Family Medicine

## 2015-04-16 ENCOUNTER — Encounter: Payer: Self-pay | Admitting: *Deleted

## 2015-04-28 ENCOUNTER — Other Ambulatory Visit: Payer: Self-pay

## 2015-04-28 DIAGNOSIS — Z1231 Encounter for screening mammogram for malignant neoplasm of breast: Secondary | ICD-10-CM

## 2015-05-28 DIAGNOSIS — H524 Presbyopia: Secondary | ICD-10-CM | POA: Diagnosis not present

## 2015-05-28 DIAGNOSIS — H52223 Regular astigmatism, bilateral: Secondary | ICD-10-CM | POA: Diagnosis not present

## 2015-06-02 ENCOUNTER — Ambulatory Visit: Payer: No Typology Code available for payment source

## 2015-07-01 ENCOUNTER — Ambulatory Visit
Admission: RE | Admit: 2015-07-01 | Discharge: 2015-07-01 | Disposition: A | Discharge status: Home / Self Care | Payer: Medicare HMO | Source: Ambulatory Visit

## 2015-07-01 DIAGNOSIS — Z1231 Encounter for screening mammogram for malignant neoplasm of breast: Secondary | ICD-10-CM | POA: Diagnosis not present

## 2015-09-24 ENCOUNTER — Other Ambulatory Visit: Payer: Self-pay | Admitting: Family Medicine

## 2015-09-28 ENCOUNTER — Telehealth: Payer: Self-pay | Admitting: *Deleted

## 2015-09-28 NOTE — Telephone Encounter (Signed)
Called in prescription [facility]

## 2015-11-01 DIAGNOSIS — M67431 Ganglion, right wrist: Secondary | ICD-10-CM | POA: Diagnosis not present

## 2015-11-02 ENCOUNTER — Ambulatory Visit (INDEPENDENT_AMBULATORY_CARE_PROVIDER_SITE_OTHER): Payer: Medicare HMO | Admitting: Physician Assistant

## 2015-11-02 VITALS — BP 124/80 | HR 106 | Temp 98.6°F | Resp 18 | Ht 62.0 in | Wt 230.0 lb

## 2015-11-02 DIAGNOSIS — N6452 Nipple discharge: Secondary | ICD-10-CM

## 2015-11-02 NOTE — Patient Instructions (Addendum)
     IF you received an x-ray today, you will receive an invoice from [facility]. Please contact [facility] at [phone] with questions or concerns regarding your invoice.   IF you received labwork today, you will receive an invoice from [employer]. Please contact [facility] at [phone] with questions or concerns regarding your invoice.   Our billing staff will not be able to assist you with questions regarding bills from these companies.  You will be contacted with the lab results as soon as they are available. The fastest way to get your results is to activate your My Chart account. Instructions are located on the last page of this paperwork. If you have not heard from [place] regarding the results in 2 weeks, please contact this office.   Please await contact for this diagnostic mammogram.  I am also placing a culture on the fluid.

## 2015-11-03 NOTE — Progress Notes (Signed)
[facility] [address], [place] [address] [phone]  Date:  11/02/2015   Name:  Deanna Allen   DOB:  [birth year]   MRN:  [id number]  PCP:  Ellsworth Lennox, MD    History of Present Illness:  Deanna Allen is a 65 y.o. female patient who presents to [facility] for nipple discharge.  Patient was scratching herself 3 days ago along her breast, and noticed that her right breast started leaking a clear fluid.  This has been leaking minimally for the last 3 days.  There is some pruritic features to this.  No pain to the area.  She has not noticed any breast changes to the nipple, breast, or skin.  Last mammogram within 4 months.        Patient Active Problem List   Diagnosis Date Noted  . Injury of left foot 12/11/2012  . Left ankle injury 12/11/2012  . Insomnia, persistent 04/25/2012  . History of anxiety disorder 04/25/2012  . Obesity, Class III, BMI 40-49.9 (morbid obesity) ([place]) 04/25/2012  . Hyperlipidemia 04/25/2012    Past Medical History  Diagnosis Date  . Arthritis   . Cancer (HCC)     basal cell CA-face  . Hyperlipidemia   . Anxiety     Past Surgical History  Procedure Laterality Date  . Colonoscopy    . Knee surgery  2001    right  . Appendectomy      Social History  Substance Use Topics  . Smoking status: Never Smoker   . Smokeless tobacco: Never Used  . Alcohol Use: Yes     Comment: 2-3 a month    Family History  Problem Relation Age of Onset  . Colon cancer Father 51  . Leukemia Father   . Heart attack Father   . Hyperlipidemia Father   . Hypertension Father   . Colon cancer Maternal Grandmother     DX  AGE 65'S  . Alcohol abuse Maternal Grandmother   . Esophageal cancer Neg Hx   . Stomach cancer Neg Hx   . Rectal cancer Neg Hx   . Diabetes Neg Hx   . Sudden death Neg Hx   . Muscular dystrophy Mother 85  . Alcohol abuse Maternal Grandfather   . Emphysema Maternal Grandfather     No Known  Allergies  Medication list has been reviewed and updated.  Current Outpatient Prescriptions on File Prior to Visit  Medication Sig Dispense Refill  . albuterol (PROVENTIL HFA;VENTOLIN HFA) 108 (90 BASE) MCG/ACT inhaler Inhale 2 puffs into the lungs every 4 (four) hours as needed for wheezing or shortness of breath (cough, shortness of breath or wheezing.). 1 Inhaler 1  . aspirin 81 MG tablet Take 81 mg by mouth daily.    . calcium carbonate (OS-CAL) 600 MG TABS Take 600 mg by mouth 2 (two) times daily with a meal.    . Cholecalciferol (VITAMIN D PO) Take 1 tablet by mouth 2 (two) times daily.    Marland Kitchen co-enzyme Q-10 50 MG capsule Take 50 mg by mouth daily.    Marland Kitchen GLUCOSAMINE-CHONDROITIN PO Take 1 tablet by mouth 2 (two) times daily.    Marland Kitchen LORazepam (ATIVAN) 2 MG tablet take 1/2 to 1 at bedtime if needed for sleep 30 tablet 1  . Multiple Vitamins-Minerals (MULTIVITAMIN PO) Take by mouth daily.    . Omega-3 Fatty Acids (FISH OIL PO) Take by mouth 2 (two) times daily.    . sertraline (ZOLOFT) 25 MG tablet  Take 1 tablet (25 mg total) by mouth daily. 90 tablet 1  . fluticasone (FLONASE) 50 MCG/ACT nasal spray Place 2 sprays into both nostrils daily. (Patient not taking: Reported on 11/02/2015) 16 g 6  . pravastatin (PRAVACHOL) 20 MG tablet Take 1 tablet (20 mg total) by mouth daily. (Patient not taking: Reported on 11/02/2015) 90 tablet 3   No current facility-administered medications on file prior to visit.    ROS ROS otherwise unremarkable unless listed above  Physical Examination: BP 124/80 mmHg  Pulse 106  Temp(Src) 98.6 F (37 C) (Oral)  Resp 18  Ht 5\' 2"  (1.575 m)  Wt 230 lb (104.327 kg)  BMI 42.06 kg/m2  SpO2 98% Ideal Body Weight: Weight in (lb) to have BMI = 25: 136.4  Physical Exam  Constitutional: She is oriented to person, place, and time. She appears well-developed and well-nourished. No distress.  HENT:  Head: Normocephalic and atraumatic.  Right Ear: External ear normal.   Left Ear: External ear normal.  Eyes: Conjunctivae and EOM are normal. Pupils are equal, round, and reactive to light.  Cardiovascular: Normal rate.   Pulmonary/Chest: Effort normal. No respiratory distress.    The 4 oclock region with an enlarged non-erythematous papule at the nipple.  This is a serous fluid.  Fibrous tissue within the breast but no palpable concerning fixed mass detectable.  Nipples are slightly inverted bilaterally.  No peau d'orange or irregular hair growths.   Neurological: She is alert and oriented to person, place, and time.  Skin: She is not diaphoretic.  Psychiatric: She has a normal mood and affect. Her behavior is normal.     Assessment and Plan: [name] is a 65 y.o. female who is here today for right breast nipple discharge. --will proceed to diagnostic breast mammgram.  Last screening was within the last 4 months.  Could be benign, but assess at this time.  1. Nipple discharge in female - MM Digital Diagnostic Unilat R - Wound culture   Ivar Drape, PA-C [facility] 11/03/2015 9:27 AM

## 2015-11-06 LAB — WOUND CULTURE
GRAM STAIN: NONE SEEN
Gram Stain: NONE SEEN
Gram Stain: NONE SEEN
Organism ID, Bacteria: NO GROWTH

## 2015-11-08 DIAGNOSIS — Z4789 Encounter for other orthopedic aftercare: Secondary | ICD-10-CM | POA: Diagnosis not present

## 2015-11-15 DIAGNOSIS — Z4789 Encounter for other orthopedic aftercare: Secondary | ICD-10-CM | POA: Diagnosis not present

## 2015-11-15 DIAGNOSIS — M67431 Ganglion, right wrist: Secondary | ICD-10-CM | POA: Diagnosis not present

## 2015-11-17 ENCOUNTER — Encounter: Payer: Self-pay | Admitting: Internal Medicine

## 2015-11-18 DIAGNOSIS — N6452 Nipple discharge: Secondary | ICD-10-CM | POA: Diagnosis not present

## 2015-11-24 DIAGNOSIS — N63 Unspecified lump in breast: Secondary | ICD-10-CM | POA: Diagnosis not present

## 2015-11-24 DIAGNOSIS — D241 Benign neoplasm of right breast: Secondary | ICD-10-CM | POA: Diagnosis not present

## 2015-11-24 DIAGNOSIS — Z0000 Encounter for general adult medical examination without abnormal findings: Secondary | ICD-10-CM | POA: Diagnosis not present

## 2015-12-07 ENCOUNTER — Other Ambulatory Visit: Payer: Self-pay | Admitting: Surgery

## 2015-12-07 DIAGNOSIS — D241 Benign neoplasm of right breast: Secondary | ICD-10-CM | POA: Diagnosis not present

## 2016-01-04 ENCOUNTER — Encounter (HOSPITAL_BASED_OUTPATIENT_CLINIC_OR_DEPARTMENT_OTHER): Payer: Self-pay | Admitting: *Deleted

## 2016-01-10 DIAGNOSIS — D241 Benign neoplasm of right breast: Secondary | ICD-10-CM | POA: Diagnosis not present

## 2016-01-10 NOTE — H&P (Signed)
Thayer Headings L. Coufal  Location: [facility] Patient #: [id number] DOB: 15-Jun-1951 Single / Language: Cleophus Molt / Race: White Female   History of Present Illness (Vera Furniss A. Ninfa Linden MD Patient words: right breast.  The patient is a 65 year old female who presents with a breast mass. This patient is referred by Dr. Christene Slates after the recent diagnosis of a papilloma of the right breast. She has a family history of breast cancer. She started having spontaneous clear right nipple discharge. An abnormality was seen on ultrasound and biopsy performed showing a papilloma at the 3 o'clock position of the right breast. She has had no previous problems regarding her breasts. She is otherwise without complaints.   Past Surgical History Davy Pique Bynum, CMA Appendectomy Breast Biopsy Right. Knee Surgery Right. Oral Surgery  Diagnostic Studies History Marjean Donna, CMA; Colonoscopy 1-5 years ago Mammogram within last year Pap Smear 1-5 years ago  Allergies Marjean Donna, [place]; No Known Drug Allergies  Medication History (Sonya Bynum, CMA LORazepam (2MG  Tablet, Oral) Active. Sertraline HCl (25MG  Tablet, Oral) Active. Omega 3 (1000MG  Capsule, Oral) Active. Calcium "900" w/D (Oral) Active. Flonase (50MCG/ACT Suspension, Nasal) Active. Aspirin (81MG  Tablet Chewable, Oral) Active. Fish Oil + D3 (1000-1000MG -UNIT Capsule, Oral) Active. Medications Reconciled  Social History Davy Pique Bynum, CMA Alcohol use Occasional alcohol use. Caffeine use Carbonated beverages. Tobacco use Never smoker.  Family History Marjean Donna, [place];  Arthritis Father, Sister. Colon Cancer Father. Heart Disease Father. Hypertension Father. Seizure disorder Son.  Pregnancy / Birth History Marjean Donna, [place];  Age at menarche 9 years. Age of menopause 105-55 Gravida 4 Maternal age 48-25 Para 3    Review of Systems ([place]; General Not Present- Appetite Loss,  Chills, Fatigue, Fever, Night Sweats, Weight Gain and Weight Loss. Skin Not Present- Change in Wart/Mole, Dryness, Hives, Jaundice, New Lesions, Non-Healing Wounds, Rash and Ulcer. HEENT Present- Seasonal Allergies. Not Present- Earache, Hearing Loss, Hoarseness, Nose Bleed, Oral Ulcers, Ringing in the Ears, Sinus Pain, Sore Throat, Visual Disturbances, Wears glasses/contact lenses and Yellow Eyes. Respiratory Not Present- Bloody sputum, Chronic Cough, Difficulty Breathing, Snoring and Wheezing. Breast Present- Nipple Discharge. Not Present- Breast Mass, Breast Pain and Skin Changes. Cardiovascular Not Present- Chest Pain, Difficulty Breathing Lying Down, Leg Cramps, Palpitations, Rapid Heart Rate, Shortness of Breath and Swelling of Extremities. Gastrointestinal Not Present- Abdominal Pain, Bloating, Bloody Stool, Change in Bowel Habits, Chronic diarrhea, Constipation, Difficulty Swallowing, Excessive gas, Gets full quickly at meals, Hemorrhoids, Indigestion, Nausea, Rectal Pain and Vomiting. Female Genitourinary Not Present- Frequency, Nocturia, Painful Urination, Pelvic Pain and Urgency. Neurological Not Present- Decreased Memory, Fainting, Headaches, Numbness, Seizures, Tingling, Tremor, Trouble walking and Weakness. Psychiatric Present- Anxiety. Not Present- Bipolar, Change in Sleep Pattern, Depression, Fearful and Frequent crying. Endocrine Not Present- Cold Intolerance, Excessive Hunger, Hair Changes, Heat Intolerance, Hot flashes and New Diabetes. Hematology Not Present- Easy Bruising, Excessive bleeding, Gland problems, HIV and Persistent Infections.  Vitals (Sonya Bynum CMA 12/07/2015 4:20 PM Weight: 227 lb Height: 62in Body Surface Area: 2.02 m Body Mass Index: 41.52 kg/m  Temp.: 60F(Temporal)  Pulse: 77 (Regular)  BP: 130/78 (Sitting, Left Arm, Standard)    Physical Exam (Chistian Kasler A. Ninfa Linden MD;  General Mental Status-Alert. General Appearance-Consistent with  stated age. Hydration-Well hydrated. Voice-Normal.  Head and Neck Head-normocephalic, atraumatic with no lesions or palpable masses. Trachea-midline. Thyroid Gland Characteristics - normal size and consistency.  Eye Eyeball - Bilateral-Extraocular movements intact. Sclera/Conjunctiva - Bilateral-No scleral icterus.  Chest and Lung Exam Chest and lung  exam reveals -quiet, even and easy respiratory effort with no use of accessory muscles and on auscultation, normal breath sounds, no adventitious sounds and normal vocal resonance. Inspection Chest Wall - Normal. Back - normal.  Breast Breast - Left-Symmetric, Non Tender, No Biopsy scars, no Dimpling, No Inflammation, No Lumpectomy scars, No Mastectomy scars, No Peau d' Orange. Breast - Right-Symmetric, Non Tender, No Biopsy scars, no Dimpling, No Inflammation, No Lumpectomy scars, No Mastectomy scars, No Peau d' Orange. Breast Lump-No Palpable Breast Mass.  Cardiovascular Cardiovascular examination reveals -normal heart sounds, regular rate and rhythm with no murmurs and normal pedal pulses bilaterally.  Abdomen - Did not examine.  Neurologic - Did not examine.  Musculoskeletal - Did not examine.  Lymphatic Head & Neck  General Head & Neck Lymphatics: Bilateral - Description - Normal. Axillary  General Axillary Region: Bilateral - Description - Normal. Tenderness - Non Tender. Femoral & Inguinal  Generalized Femoral & Inguinal Lymphatics: Bilateral - Description - Normal. Tenderness - Non Tender.    Assessment & Plan (Kaylie Ritter A. Ninfa Linden MD;  Madelyn Flavors PAPILLOMA OF BREAST, RIGHT (D24.1)  Impression: I explained the pathology of the right breast papilloma to her and the reasons for surgical excision of this area. I discussed radioactive seed right breast lumpectomy with her in detail. I discussed the risks of surgery which includes but is not limited to bleeding, infection, need for further  surgery if malignancy is found, cardiopulmonary issues, postop recovery, etc. She understands and wishes to proceed with surgery which will be scheduled

## 2016-01-10 NOTE — Progress Notes (Signed)
Pt given 8 oz carton of boost breeze with written and verbal instructions to drink the  Morning of surgery by 0815. Also instructed this is the only liquid after midnight she is allowed.  Teach back and verbalized understanding

## 2016-01-11 ENCOUNTER — Ambulatory Visit (HOSPITAL_BASED_OUTPATIENT_CLINIC_OR_DEPARTMENT_OTHER): Payer: Medicare HMO | Admitting: Certified Registered"

## 2016-01-11 ENCOUNTER — Encounter (HOSPITAL_BASED_OUTPATIENT_CLINIC_OR_DEPARTMENT_OTHER): Payer: Self-pay | Admitting: Certified Registered"

## 2016-01-11 ENCOUNTER — Ambulatory Visit (HOSPITAL_BASED_OUTPATIENT_CLINIC_OR_DEPARTMENT_OTHER)
Admission: RE | Admit: 2016-01-11 | Discharge: 2016-01-11 | Disposition: A | Discharge status: Home / Self Care | Payer: Medicare HMO | Source: Ambulatory Visit | Attending: Surgery | Admitting: Surgery

## 2016-01-11 ENCOUNTER — Encounter (HOSPITAL_BASED_OUTPATIENT_CLINIC_OR_DEPARTMENT_OTHER)
Admission: RE | Disposition: A | Discharge status: Home / Self Care | Payer: Self-pay | Source: Ambulatory Visit | Attending: Surgery

## 2016-01-11 DIAGNOSIS — D241 Benign neoplasm of right breast: Secondary | ICD-10-CM | POA: Diagnosis not present

## 2016-01-11 DIAGNOSIS — Z803 Family history of malignant neoplasm of breast: Secondary | ICD-10-CM | POA: Insufficient documentation

## 2016-01-11 DIAGNOSIS — N63 Unspecified lump in breast: Secondary | ICD-10-CM | POA: Diagnosis not present

## 2016-01-11 HISTORY — PX: BREAST LUMPECTOMY WITH RADIOACTIVE SEED LOCALIZATION: SHX6424

## 2016-01-11 SURGERY — BREAST LUMPECTOMY WITH RADIOACTIVE SEED LOCALIZATION
Anesthesia: General | Site: Breast | Laterality: Right

## 2016-01-11 MED ORDER — OXYCODONE HCL 5 MG/5ML PO SOLN: 5.0000 mg | Freq: Once | ORAL | Status: DC | PRN

## 2016-01-11 MED ORDER — DEXAMETHASONE SODIUM PHOSPHATE 10 MG/ML IJ SOLN
INTRAMUSCULAR | Status: AC
  Filled 2016-01-11: qty 1

## 2016-01-11 MED ORDER — FENTANYL CITRATE (PF) 100 MCG/2ML IJ SOLN: 25.0000 ug | INTRAMUSCULAR | Status: DC | PRN

## 2016-01-11 MED ORDER — ONDANSETRON HCL 4 MG/2ML IJ SOLN: 4.0000 mg | Freq: Once | INTRAMUSCULAR | Status: DC | PRN

## 2016-01-11 MED ORDER — LIDOCAINE 2% (20 MG/ML) 5 ML SYRINGE
INTRAMUSCULAR | Status: DC | PRN
  Administered 2016-01-11: 60 mg via INTRAVENOUS

## 2016-01-11 MED ORDER — CEFAZOLIN SODIUM-DEXTROSE 2-4 GM/100ML-% IV SOLN
2.0000 g | INTRAVENOUS | Status: AC
  Administered 2016-01-11: 2 g via INTRAVENOUS

## 2016-01-11 MED ORDER — BUPIVACAINE-EPINEPHRINE 0.25% -1:200000 IJ SOLN
INTRAMUSCULAR | Status: DC | PRN
  Administered 2016-01-11: 16 mL

## 2016-01-11 MED ORDER — SCOPOLAMINE 1 MG/3DAYS TD PT72: 1.0000 | MEDICATED_PATCH | Freq: Once | TRANSDERMAL | Status: DC | PRN

## 2016-01-11 MED ORDER — ONDANSETRON HCL 4 MG/2ML IJ SOLN
INTRAMUSCULAR | Status: AC
  Filled 2016-01-11: qty 2

## 2016-01-11 MED ORDER — PROPOFOL 500 MG/50ML IV EMUL
INTRAVENOUS | Status: AC
  Filled 2016-01-11: qty 50

## 2016-01-11 MED ORDER — PROPOFOL 10 MG/ML IV BOLUS
INTRAVENOUS | Status: DC | PRN
  Administered 2016-01-11: 200 mg via INTRAVENOUS

## 2016-01-11 MED ORDER — DEXAMETHASONE SODIUM PHOSPHATE 4 MG/ML IJ SOLN
INTRAMUSCULAR | Status: DC | PRN
  Administered 2016-01-11: 10 mg via INTRAVENOUS

## 2016-01-11 MED ORDER — GLYCOPYRROLATE 0.2 MG/ML IJ SOLN: 0.2000 mg | Freq: Once | INTRAMUSCULAR | Status: DC | PRN

## 2016-01-11 MED ORDER — LACTATED RINGERS IV SOLN
INTRAVENOUS | Status: DC
  Administered 2016-01-11: 10 mL/h via INTRAVENOUS
  Administered 2016-01-11: 11:00:00 via INTRAVENOUS

## 2016-01-11 MED ORDER — LIDOCAINE 2% (20 MG/ML) 5 ML SYRINGE
INTRAMUSCULAR | Status: AC
  Filled 2016-01-11: qty 5

## 2016-01-11 MED ORDER — FENTANYL CITRATE (PF) 100 MCG/2ML IJ SOLN
50.0000 ug | INTRAMUSCULAR | Status: DC | PRN
  Administered 2016-01-11: 100 ug via INTRAVENOUS

## 2016-01-11 MED ORDER — MIDAZOLAM HCL 2 MG/2ML IJ SOLN: 1.0000 mg | INTRAMUSCULAR | Status: DC | PRN

## 2016-01-11 MED ORDER — OXYCODONE HCL 5 MG PO TABS: 5.0000 mg | ORAL_TABLET | Freq: Once | ORAL | Status: DC | PRN

## 2016-01-11 MED ORDER — ONDANSETRON HCL 4 MG/2ML IJ SOLN
INTRAMUSCULAR | Status: DC | PRN
  Administered 2016-01-11: 4 mg via INTRAVENOUS

## 2016-01-11 MED ORDER — FENTANYL CITRATE (PF) 100 MCG/2ML IJ SOLN
INTRAMUSCULAR | Status: AC
  Filled 2016-01-11: qty 2

## 2016-01-11 SURGICAL SUPPLY — 41 items
APPLIER CLIP 9.375 MED OPEN (MISCELLANEOUS)
BINDER BREAST XLRG (GAUZE/BANDAGES/DRESSINGS) ×2 IMPLANT
BLADE HEX COATED 2.75 (ELECTRODE) ×2 IMPLANT
BLADE SURG 15 STRL LF DISP TIS (BLADE) ×1 IMPLANT
BLADE SURG 15 STRL SS (BLADE) ×1
CANISTER SUCT 1200ML W/VALVE (MISCELLANEOUS) IMPLANT
CHLORAPREP W/TINT 26ML (MISCELLANEOUS) ×2 IMPLANT
CLIP APPLIE 9.375 MED OPEN (MISCELLANEOUS) IMPLANT
CLIP TI WIDE RED SMALL 6 (CLIP) IMPLANT
COVER BACK TABLE 60X90IN (DRAPES) ×2 IMPLANT
COVER MAYO STAND STRL (DRAPES) ×2 IMPLANT
COVER PROBE W GEL 5X96 (DRAPES) ×2 IMPLANT
DECANTER SPIKE VIAL GLASS SM (MISCELLANEOUS) IMPLANT
DEVICE DUBIN W/COMP PLATE 8390 (MISCELLANEOUS) ×2 IMPLANT
DRAPE LAPAROTOMY 100X72 PEDS (DRAPES) ×2 IMPLANT
DRAPE UTILITY XL STRL (DRAPES) ×2 IMPLANT
ELECT REM PT RETURN 9FT ADLT (ELECTROSURGICAL) ×2
ELECTRODE REM PT RTRN 9FT ADLT (ELECTROSURGICAL) ×1 IMPLANT
GLOVE SURG SIGNA 7.5 PF LTX (GLOVE) ×2 IMPLANT
GOWN STRL REUS W/ TWL LRG LVL3 (GOWN DISPOSABLE) ×1 IMPLANT
GOWN STRL REUS W/ TWL XL LVL3 (GOWN DISPOSABLE) ×1 IMPLANT
GOWN STRL REUS W/TWL LRG LVL3 (GOWN DISPOSABLE) ×1
GOWN STRL REUS W/TWL XL LVL3 (GOWN DISPOSABLE) ×1
KIT MARKER MARGIN INK (KITS) ×2 IMPLANT
LIQUID BAND (GAUZE/BANDAGES/DRESSINGS) ×2 IMPLANT
NEEDLE HYPO 25X1 1.5 SAFETY (NEEDLE) ×2 IMPLANT
NS IRRIG 1000ML POUR BTL (IV SOLUTION) IMPLANT
PACK BASIN DAY SURGERY FS (CUSTOM PROCEDURE TRAY) ×2 IMPLANT
PENCIL BUTTON HOLSTER BLD 10FT (ELECTRODE) ×2 IMPLANT
SLEEVE SCD COMPRESS KNEE MED (MISCELLANEOUS) ×2 IMPLANT
SPONGE GAUZE 4X4 12PLY STER LF (GAUZE/BANDAGES/DRESSINGS) IMPLANT
SPONGE LAP 4X18 X RAY DECT (DISPOSABLE) ×2 IMPLANT
SUT MNCRL AB 4-0 PS2 18 (SUTURE) ×2 IMPLANT
SUT SILK 2 0 SH (SUTURE) IMPLANT
SUT VIC AB 3-0 SH 27 (SUTURE) ×1
SUT VIC AB 3-0 SH 27X BRD (SUTURE) ×1 IMPLANT
SYR CONTROL 10ML LL (SYRINGE) ×2 IMPLANT
TOWEL OR 17X24 6PK STRL BLUE (TOWEL DISPOSABLE) ×2 IMPLANT
TOWEL OR NON WOVEN STRL DISP B (DISPOSABLE) IMPLANT
TUBE CONNECTING 20X1/4 (TUBING) IMPLANT
YANKAUER SUCT BULB TIP NO VENT (SUCTIONS) IMPLANT

## 2016-01-11 NOTE — Discharge Instructions (Signed)
[place] Office Phone Number [phone]  BREAST BIOPSY/ PARTIAL MASTECTOMY: POST OP INSTRUCTIONS  Always review your discharge instruction sheet given to you by the facility where your surgery was performed.  IF YOU HAVE DISABILITY OR FAMILY LEAVE FORMS, YOU MUST BRING THEM TO THE OFFICE FOR PROCESSING.  DO NOT GIVE THEM TO YOUR DOCTOR.  1. A prescription for pain medication may be given to you upon discharge.  Take your pain medication as prescribed, if needed.  If narcotic pain medicine is not needed, then you may take acetaminophen (Tylenol) or ibuprofen (Advil) as needed. 2. Take your usually prescribed medications unless otherwise directed 3. If you need a refill on your pain medication, please contact your pharmacy.  They will contact our office to request authorization.  Prescriptions will not be filled after 5pm or on week-ends. 4. You should eat very light the first 24 hours after surgery, such as soup, crackers, pudding, etc.  Resume your normal diet the day after surgery. 5. Most patients will experience some swelling and bruising in the breast.  Ice packs and a good support bra will help.  Swelling and bruising can take several days to resolve.  6. It is common to experience some constipation if taking pain medication after surgery.  Increasing fluid intake and taking a stool softener will usually help or prevent this problem from occurring.  A mild laxative (Milk of Magnesia or Miralax) should be taken according to package directions if there are no bowel movements after 48 hours. 7. Unless discharge instructions indicate otherwise, you may remove your bandages 24-48 hours after surgery, and you may shower at that time.  You may have steri-strips (small skin tapes) in place directly over the incision.  These strips should be left on the skin for 7-10 days.  If your surgeon used skin glue on the incision, you may shower in 24 hours.  The glue will flake off over the  next 2-3 weeks.  Any sutures or staples will be removed at the office during your follow-up visit. 8. ACTIVITIES:  You may resume regular daily activities (gradually increasing) beginning the next day.  Wearing a good support bra or sports bra minimizes pain and swelling.  You may have sexual intercourse when it is comfortable. a. You may drive when you no longer are taking prescription pain medication, you can comfortably wear a seatbelt, and you can safely maneuver your car and apply brakes. b. RETURN TO WORK:  ______________________________________________________________________________________ 9. You should see your doctor in the office for a follow-up appointment approximately two weeks after your surgery.  Your doctors nurse will typically make your follow-up appointment when she calls you with your pathology report.  Expect your pathology report 2-3 business days after your surgery.  You may call to check if you do not hear from [place] after three days. 10. OTHER INSTRUCTIONS: ____OK TO SHOWER TOMORROW 11. ICE PACK AND IBUPROFEN ALSO FOR PAIN.  YOU MAY ALSO USE THE HYDROCODONE YOU HAVE AT HOME FOR PAIN. 12. ___________________________________________________________________________________________ _____________________________________________________________________________________________________________________________________ _____________________________________________________________________________________________________________________________________ _____________________________________________________________________________________________________________________________________  WHEN TO CALL YOUR DOCTOR: 1. Fever over 101.0 2. Nausea and/or vomiting. 3. Extreme swelling or bruising. 4. Continued bleeding from incision. 5. Increased pain, redness, or drainage from the incision.  The clinic staff is available to answer your questions during regular business hours.  Please dont  hesitate to call and ask to speak to one of the nurses for clinical concerns.  If you have a medical emergency, go to the nearest emergency  room or call 911.  A surgeon from [facility] is always on call at the hospital.  For further questions, please visit [email or web address]      Post Anesthesia Home Care Instructions  Activity: Get plenty of rest for the remainder of the day. A responsible adult should stay with you for 24 hours following the procedure.  For the next 24 hours, DO NOT: -Drive a car -Paediatric nurse -Drink alcoholic beverages -Take any medication unless instructed by your physician -Make any legal decisions or sign important papers.  Meals: Start with liquid foods such as gelatin or soup. Progress to regular foods as tolerated. Avoid greasy, spicy, heavy foods. If nausea and/or vomiting occur, drink only clear liquids until the nausea and/or vomiting subsides. Call your physician if vomiting continues.  Special Instructions/Symptoms: Your throat may feel dry or sore from the anesthesia or the breathing tube placed in your throat during surgery. If this causes discomfort, gargle with warm salt water. The discomfort should disappear within 24 hours.  If you had a scopolamine patch placed behind your ear for the management of post- operative nausea and/or vomiting:  1. The medication in the patch is effective for 72 hours, after which it should be removed.  Wrap patch in a tissue and discard in the trash. Wash hands thoroughly with soap and water. 2. You may remove the patch earlier than 72 hours if you experience unpleasant side effects which may include dry mouth, dizziness or visual disturbances. 3. Avoid touching the patch. Wash your hands with soap and water after contact with the patch.

## 2016-01-11 NOTE — Transfer of Care (Signed)
Immediate Anesthesia Transfer of Care Note  Patient: Deanna Allen  Procedure(s) Performed: Procedure(s) with comments: RIGHT BREAST LUMPECTOMY WITH RADIOACTIVE SEED LOCALIZATION (Right) - RIGHT BREAST LUMPECTOMY WITH RADIOACTIVE SEED LOCALIZATION  Patient Location: PACU  Anesthesia Type:General  Level of Consciousness: awake, alert  and patient cooperative  Airway & Oxygen Therapy: Patient Spontanous Breathing and Patient connected to face mask oxygen  Post-op Assessment: Report given to RN, Post -op Vital signs reviewed and stable and Patient moving all extremities  Post vital signs: Reviewed and stable  Last Vitals:  Filed Vitals:   01/11/16 1039  BP: 134/66  Pulse: 79  Temp: 36.8 C  Resp: 18    Last Pain: There were no vitals filed for this visit.       Complications: No apparent anesthesia complications

## 2016-01-11 NOTE — Anesthesia Preprocedure Evaluation (Addendum)
Anesthesia Evaluation  Patient identified by MRN, date of birth, ID band Patient awake    Reviewed: Allergy & Precautions, NPO status , Patient's Chart, lab work & pertinent test results  Airway Mallampati: II  TM Distance: >3 FB Neck ROM: Full    Dental  (+) Teeth Intact, Dental Advisory Given   Pulmonary  breath sounds clear to auscultation        Cardiovascular Rhythm:Regular Rate:Normal     Neuro/Psych    GI/Hepatic   Endo/Other    Renal/GU      Musculoskeletal   Abdominal (+) + obese,   Peds  Hematology   Anesthesia Other Findings   Reproductive/Obstetrics                             Anesthesia Physical Anesthesia Plan  ASA: II  Anesthesia Plan: General   Post-op Pain Management:    Induction: Intravenous  Airway Management Planned: LMA  Additional Equipment:   Intra-op Plan:   Post-operative Plan:   Informed Consent: I have reviewed the patients History and Physical, chart, labs and discussed the procedure including the risks, benefits and alternatives for the proposed anesthesia with the patient or authorized representative who has indicated his/her understanding and acceptance.   Dental advisory given  Plan Discussed with: CRNA and Anesthesiologist  Anesthesia Plan Comments:         Anesthesia Quick Evaluation  

## 2016-01-11 NOTE — Anesthesia Procedure Notes (Signed)
Procedure Name: LMA Insertion Date/Time: 01/11/2016 11:41 AM Performed by: Baxter Flattery Pre-anesthesia Checklist: Patient identified, Emergency Drugs available, Suction available and Patient being monitored Patient Re-evaluated:Patient Re-evaluated prior to inductionOxygen Delivery Method: Circle system utilized Preoxygenation: Pre-oxygenation with 100% oxygen Intubation Type: IV induction Ventilation: Mask ventilation without difficulty LMA: LMA inserted LMA Size: 4.0 Number of attempts: 1 Airway Equipment and Method: Bite block Placement Confirmation: positive ETCO2 and breath sounds checked- equal and bilateral Tube secured with: Tape Dental Injury: Teeth and Oropharynx as per pre-operative assessment

## 2016-01-11 NOTE — Anesthesia Postprocedure Evaluation (Signed)
Anesthesia Post Note  Patient: Deanna Allen  Procedure(s) Performed: Procedure(s) (LRB): RIGHT BREAST LUMPECTOMY WITH RADIOACTIVE SEED LOCALIZATION (Right)  Patient location during evaluation: PACU Anesthesia Type: General Level of consciousness: awake, awake and alert and oriented Pain management: pain level controlled Vital Signs Assessment: post-procedure vital signs reviewed and stable Respiratory status: spontaneous breathing, nonlabored ventilation and respiratory function stable Cardiovascular status: blood pressure returned to baseline Anesthetic complications: no    Last Vitals:  Filed Vitals:   01/11/16 1300 01/11/16 1319  BP: 145/85 160/69  Pulse: 76 70  Temp:  36.7 C  Resp: 15 16    Last Pain:  Filed Vitals:   01/11/16 1327  PainSc: 0-No pain                 Lawrence Mitch COKER

## 2016-01-11 NOTE — Op Note (Signed)
RIGHT BREAST LUMPECTOMY WITH RADIOACTIVE SEED LOCALIZATION  Procedure Note  Deanna Allen 01/11/2016   Pre-op Diagnosis: Right breast papilloma      Post-op Diagnosis: same  Procedure(s): RIGHT BREAST LUMPECTOMY WITH RADIOACTIVE SEED LOCALIZATION  Surgeon(s): Coralie Keens, MD  Anesthesia: Choice  Staff:  Circulator: Izora Ribas, RN; Gaetano Net, RN Scrub Person: Vara Guardian, RN  Estimated Blood Loss: Minimal               Specimens: sent to path          [facility] A   Date: 01/11/2016  Time: 12:06 PM

## 2016-01-11 NOTE — Interval H&P Note (Signed)
History and Physical Interval Note: no change in H and P  01/11/2016 10:33 AM  Deanna Allen  has presented today for surgery, with the diagnosis of Right breast papilloma   The various methods of treatment have been discussed with the patient and family. After consideration of risks, benefits and other options for treatment, the patient has consented to  Procedure(s): RIGHT BREAST LUMPECTOMY WITH RADIOACTIVE SEED LOCALIZATION (Right) as a surgical intervention .  The patient's history has been reviewed, patient examined, no change in status, stable for surgery.  I have reviewed the patient's chart and labs.  Questions were answered to the patient's satisfaction.     Asad Keeven A

## 2016-01-12 ENCOUNTER — Encounter (HOSPITAL_BASED_OUTPATIENT_CLINIC_OR_DEPARTMENT_OTHER): Payer: Self-pay | Admitting: Surgery

## 2016-01-12 NOTE — Op Note (Signed)
NAMEMELANNY, KAWABATA NO.:  [id number]  MEDICAL RECORD NO.:  [id number]  LOCATION:                                 FACILITY:  PHYSICIAN:  Coralie Keens, M.D. DATE OF BIRTH:  15-Apr-1951  DATE OF PROCEDURE:  01/11/2016 DATE OF DISCHARGE:                              OPERATIVE REPORT   PREOPERATIVE DIAGNOSIS:  Right breast papilloma.  POSTOPERATIVE DIAGNOSIS:  Right breast papilloma.  PROCEDURE:  Radioactive seed-localized right breast lumpectomy.  SURGEON:  Coralie Keens, M.D.  ANESTHESIA:  General and 0.25% Marcaine with epinephrine.  ESTIMATED BLOOD LOSS:  Minimal.  INDICATIONS:  This is a 65 year old female, who was found to have clear nipple discharge from the right breast.  Mammograms and ultrasound performed demonstrating a lesion near the areola of the right breast. Stereotactic biopsy was performed showing this to be a papilloma. Decision was made to proceed to the operating room for excision.  FINDINGS:  The lumpectomy was performed and the radioactive seed and previous marker were found to be in the lumpectomy specimen, which was sent to Pathology for evaluation.  PROCEDURE IN DETAIL:  The patient was brought to the operating room, identified as Deanna Allen.  She was taken to the operating room. She was placed in supine position on the operating table and general anesthesia was induced.  Her right breast was then prepped and draped in usual sterile fashion.  Using the Neoprobe, identified an area of increased uptake at the 3 o'clock to 4 o'clock position of the right breast adjacent to the areola.  I anesthetized the skin of the areola edge with Marcaine.  I then made a circumareolar incision with the scalpel.  I took this down to the breast tissue with electrocautery.  I then performed a wide lumpectomy going widely around the radioactive seed with the aid of Neoprobe.  Once the specimen was completely removed, I marked all  margins with marker paint.  I confirmed that the seed was indeed in the specimen with the Neoprobe.  I then x-rayed the specimen and again the radioactive seed and previous marker were in the specimen.  The specimen was sent to Pathology for evaluation.  I then placed a single surgical clip into the lumpectomy cavity.  I achieved hemostasis with cautery.  I anesthetized the wound further with Marcaine.  I then closed the subcutaneous tissue with interrupted 3-0 Vicryl sutures and closed the skin with running 4-0 Monocryl.  Skin glue was then applied.  The patient tolerated the procedure well.  All sponge, needle, and instrument counts were correct at the end of the procedure. The patient was then extubated in the operating room and taken in a stable condition to the recovery room.     Coralie Keens, M.D.   ______________________________ Coralie Keens, M.D.    DB/MEDQ  D:  01/11/2016  T:  01/11/2016  Job:  [id number]

## 2016-01-13 ENCOUNTER — Other Ambulatory Visit: Payer: Self-pay | Admitting: Family Medicine

## 2016-01-19 ENCOUNTER — Telehealth: Payer: Self-pay

## 2016-01-19 NOTE — Telephone Encounter (Signed)
PATIENT STATES Deanna Allen DENIED HER LORAZEPAM 2 MG BECAUSE SHE NEEDS AN OFFICE VISIT. SHE SAID SHE HAS TAKEN THIS MEDICATION FOR 20 YEARS AND NO ONE HAS EVER DENIED IT. SHE SAID IT IS NOT TIME FOR HER TO COME INTO THE OFFICE UNTIL AROUND OCT. SHE WOULD LIKE TO GET A CALL BACK TO DISCUSS THIS MATTER. BEST PHONE [phone] (HOME)  [place]   [place]

## 2016-01-24 ENCOUNTER — Ambulatory Visit (INDEPENDENT_AMBULATORY_CARE_PROVIDER_SITE_OTHER): Payer: Medicare HMO | Admitting: Physician Assistant

## 2016-01-24 VITALS — BP 124/82 | HR 101 | Temp 98.5°F | Resp 18 | Ht 62.0 in | Wt 222.0 lb

## 2016-01-24 DIAGNOSIS — G4700 Insomnia, unspecified: Secondary | ICD-10-CM

## 2016-01-24 MED ORDER — LORAZEPAM 2 MG PO TABS: ORAL_TABLET | ORAL | Status: DC

## 2016-01-24 NOTE — Telephone Encounter (Signed)
Pt came into be seen 7/3

## 2016-01-24 NOTE — Progress Notes (Signed)
Patient ID: Deanna Allen, female    DOB: 06/14/1951, 65 y.o.   MRN: [id number]  PCP: Ellsworth Lennox, MD  Subjective:   Chief Complaint  Patient presents with  . Medication Refill    Ativan    HPI Presents for medication refill: lorazepam.  The patient was previously followed by Dr. Leward Quan, who retired 11/2014. She has not established with another provider and was told that she must in order to get refills of her medication. She has been out x 5 days, and reports having slept very little "maybe two hours." Insomnia is long-standing. Even as a child, she had trouble sleeping. Then, one of her sons developed night-terrors and didn't sleep well x 10 years, and her symptoms haven't subsided.  She takes 12.5 mg of sertraline daily, "just so I have some in my system" in case she has another panic attack. She isn't interested in a therapeutic dose due to previous weight gain associated with higher dose.  Last CPE was 03/16/2015 with Ms. Carlean Purl, who is no longer at this practice. There are several outstanding health maintenance items, but the patient desires to do those at her next wellness visit.    Review of Systems Insomnia.    Patient Active Problem List   Diagnosis Date Noted  . Insomnia, persistent 04/25/2012  . History of anxiety disorder 04/25/2012  . Obesity, Class III, BMI 40-49.9 (morbid obesity) ([place]) 04/25/2012  . Hyperlipidemia 04/25/2012     Prior to Admission medications   Medication Sig Start Date End Date Taking? Authorizing Provider  aspirin 81 MG tablet Take 81 mg by mouth daily.   Yes Historical Provider, MD  calcium carbonate (OS-CAL) 600 MG TABS Take 600 mg by mouth 2 (two) times daily with a meal.   Yes Historical Provider, MD  Cholecalciferol (VITAMIN D PO) Take 1 tablet by mouth 2 (two) times daily.   Yes Historical Provider, MD  co-enzyme Q-10 50 MG capsule Take 50 mg by mouth daily.   Yes Historical Provider, MD  GLUCOSAMINE-CHONDROITIN PO Take  1 tablet by mouth 2 (two) times daily.   Yes Historical Provider, MD  LORazepam (ATIVAN) 2 MG tablet take 1/2 to 1 at bedtime if needed for sleep 09/28/15  Yes Elby Beck, FNP  Multiple Vitamins-Minerals (MULTIVITAMIN PO) Take by mouth daily.   Yes Historical Provider, MD  Omega-3 Fatty Acids (FISH OIL PO) Take by mouth 2 (two) times daily.   Yes Historical Provider, MD  sertraline (ZOLOFT) 25 MG tablet Take 1 tablet (25 mg total) by mouth daily. 03/16/15  Yes Elby Beck, FNP     No Known Allergies     Objective:  Physical Exam  Constitutional: She is oriented to person, place, and time. She appears well-developed and well-nourished. She is active and cooperative. No distress.  BP 124/82 mmHg  Pulse 101  Temp(Src) 98.5 F (36.9 C) (Oral)  Resp 18  Ht 5\' 2"  (1.575 m)  Wt 222 lb (100.699 kg)  BMI 40.59 kg/m2  SpO2 96%   Eyes: Conjunctivae are normal.  Pulmonary/Chest: Effort normal.  Neurological: She is alert and oriented to person, place, and time.  Psychiatric: She has a normal mood and affect. Her speech is normal and behavior is normal.           Assessment & Plan:   1. Insomnia, persistent - LORazepam (ATIVAN) 2 MG tablet; take 1/2 to 1 at bedtime if needed for sleep  Dispense: 30 tablet; Refill: 0   Return for  annual wellness visit after 03/15/2016.     Fara Chute, PA-C Physician Assistant-Certified [facility]

## 2016-01-24 NOTE — Patient Instructions (Signed)
     IF you received an x-ray today, you will receive an invoice from [facility]. Please contact  Radiology at [phone] with questions or concerns regarding your invoice.   IF you received labwork today, you will receive an invoice from [facility]. Please contact [facility] at [phone] with questions or concerns regarding your invoice.   Our billing staff will not be able to assist you with questions regarding bills from these companies.  You will be contacted with the lab results as soon as they are available. The fastest way to get your results is to activate your My Chart account. Instructions are located on the last page of this paperwork. If you have not heard from us regarding the results in 2 weeks, please contact this office.      

## 2016-02-07 ENCOUNTER — Ambulatory Visit (INDEPENDENT_AMBULATORY_CARE_PROVIDER_SITE_OTHER): Payer: Medicare HMO | Admitting: Physician Assistant

## 2016-02-07 VITALS — BP 118/80 | HR 96 | Temp 98.0°F | Resp 17 | Ht 62.0 in | Wt 228.0 lb

## 2016-02-07 DIAGNOSIS — H6123 Impacted cerumen, bilateral: Secondary | ICD-10-CM | POA: Diagnosis not present

## 2016-02-07 MED ORDER — MINERAL OIL PO OIL: TOPICAL_OIL | ORAL | Status: DC

## 2016-02-07 NOTE — Patient Instructions (Signed)
     IF you received an x-ray today, you will receive an invoice from [facility]. Please contact [facility] at [phone] with questions or concerns regarding your invoice.   IF you received labwork today, you will receive an invoice from [facility]. Please contact [facility] at [phone] with questions or concerns regarding your invoice.   Our billing staff will not be able to assist you with questions regarding bills from these companies.  You will be contacted with the lab results as soon as they are available. The fastest way to get your results is to activate your My Chart account. Instructions are located on the last page of this paperwork. If you have not heard from us regarding the results in 2 weeks, please contact this office.      

## 2016-02-07 NOTE — Progress Notes (Signed)
02/07/2016 4:15 PM   DOB: 1951-02-02 / MRN: [id number]  SUBJECTIVE:  Deanna Allen is a 65 y.o. female presenting for bilateral cerumen impaction.  She reports this occurs about every 6 months and has she has been this way for the last 3-4 years.  She denies any pain today.  Reports her hearing is worsened on the right side.  She feels that her allergies have been getting somewhat worse over the last decade.  Denies placing anything in the ears (q-tips).   She has No Known Allergies.   She  has a past medical history of Arthritis; Cancer ([place]); Hyperlipidemia; Anxiety; and Left ankle injury (12/11/2012).    She  reports that she has never smoked. She has never used smokeless tobacco. She reports that she drinks alcohol. She reports that she does not use illicit drugs. She  reports that she currently engages in sexual activity. The patient  has past surgical history that includes Colonoscopy; Knee surgery (2001); Appendectomy; Ganglion cyst excision; and Breast lumpectomy with radioactive seed localization (Right, 01/11/2016).  Her family history includes Alcohol abuse in her maternal grandfather and maternal grandmother; Colon cancer in her maternal grandmother; Colon cancer (age of onset: 48) in her father; Emphysema in her maternal grandfather; Heart attack in her father; Hyperlipidemia in her father; Hypertension in her father; Leukemia in her father; Muscular dystrophy (age of onset: 88) in her mother. There is no history of Esophageal cancer, Stomach cancer, Rectal cancer, Diabetes, or Sudden death.  Review of Systems  Constitutional: Negative for fever.  HENT: Positive for hearing loss (Right>left). Negative for congestion, ear discharge, sore throat and tinnitus.   Gastrointestinal: Negative for nausea.  Skin: Negative for itching and rash.  Neurological: Negative for dizziness and headaches.  Psychiatric/Behavioral: Negative for depression.    Problem list and medications reviewed  and updated by myself where necessary, and exist elsewhere in the encounter.   OBJECTIVE:  BP 118/80 mmHg  Pulse 96  Temp(Src) 98 F (36.7 C) (Oral)  Resp 17  Ht 5\' 2"  (1.575 m)  Wt 228 lb (103.42 kg)  BMI 41.69 kg/m2  SpO2 97%  Physical Exam  Constitutional: She is oriented to person, place, and time. She appears well-nourished. No distress.  HENT:  Mouth/Throat: Uvula is midline, oropharynx is clear and moist and mucous membranes are normal.  Bilateral cerumen impaction. Weber lateralizing to right side.   Eyes: EOM are normal. Pupils are equal, round, and reactive to light.  Cardiovascular: Normal rate and regular rhythm.   Pulmonary/Chest: Effort normal and breath sounds normal.  Abdominal: Soft. Bowel sounds are normal. She exhibits no distension.  Musculoskeletal: Normal range of motion.  Neurological: She is alert and oriented to person, place, and time. No cranial nerve deficit. Gait normal.  Skin: Skin is warm and dry. She is not diaphoretic.  Psychiatric: She has a normal mood and affect.  Vitals reviewed.   No results found for this or any previous visit (from the past 72 hour(s)).  No results found.  ASSESSMENT AND PLAN  Tashyia was seen today for ear problem.  Diagnoses and all orders for this visit:  Cerumen impaction, bilateral: Disimpacted in the office.  TM pearly gray bilaterally post lavage. Advised mineral oil weekly.    -     Ear wax removal    The patient was advised to call or return to clinic if she does not see an improvement in symptoms, or to seek the care of the closest emergency department  if she worsens with the above plan.   Philis Fendt, MHS, PA-C [facility] 02/07/2016 4:15 PM

## 2016-03-15 ENCOUNTER — Ambulatory Visit (INDEPENDENT_AMBULATORY_CARE_PROVIDER_SITE_OTHER): Payer: Medicare HMO | Admitting: Physician Assistant

## 2016-03-15 VITALS — BP 120/80 | HR 101 | Temp 98.4°F | Resp 16 | Ht 62.0 in | Wt 229.8 lb

## 2016-03-15 DIAGNOSIS — M1990 Unspecified osteoarthritis, unspecified site: Secondary | ICD-10-CM | POA: Insufficient documentation

## 2016-03-15 DIAGNOSIS — G4700 Insomnia, unspecified: Secondary | ICD-10-CM | POA: Diagnosis not present

## 2016-03-15 MED ORDER — LORAZEPAM 2 MG PO TABS: ORAL_TABLET | ORAL | 0 refills | Status: DC

## 2016-03-15 NOTE — Patient Instructions (Addendum)
I look forward to seeing you on 05/02/2016 for your physical and fasting labs!    IF you received an x-ray today, you will receive an invoice from [facility]. Please contact [facility] at [phone] with questions or concerns regarding your invoice.   IF you received labwork today, you will receive an invoice from [facility]. Please contact [facility] at [phone] with questions or concerns regarding your invoice.   Our billing staff will not be able to assist you with questions regarding bills from these companies.  You will be contacted with the lab results as soon as they are available. The fastest way to get your results is to activate your My Chart account. Instructions are located on the last page of this paperwork. If you have not heard from [place] regarding the results in 2 weeks, please contact this office.

## 2016-03-15 NOTE — Progress Notes (Signed)
Patient ID: Deanna Allen, female    DOB: 07/30/1950, 65 y.o.   MRN: [id number]  PCP: Harrison Mons, PA-C  Subjective:   Chief Complaint  Patient presents with  . Medication Refill    Lorazepam, patient would like to know if she can get greater than a month supply because she will be on vacation when her next refill will be due     HPI Presents for refill of lorazepam.  Currently takes 1/2 tablet every night. Tried taking 1/4 tablet, but it didn't help. If she doesn't take it, she can't fall asleep until 5-6 am. She sleeps well until about 8:30 am. Her elderly dog is waking up now about 6 am and needs to go out. No alcohol, caffeine. Minimal stress. Mother reports she has never been a good sleeper. Her middle child had night terrors for 10 years, and she was never able to resolve her sleep issues after that, except with lorazepam.  No panic attack in years. Stays on low-dose sertraline to make sure.  Leaves for annual trip home on 03/29/2016, returning home 9/22. Her CPE is scheduled with me for 05/02/2016.   Review of Systems As above.    Patient Active Problem List   Diagnosis Date Noted  . Arthritis   . Insomnia, persistent 04/25/2012  . History of anxiety disorder 04/25/2012  . Obesity, Class III, BMI 40-49.9 (morbid obesity) ([place]) 04/25/2012  . Hyperlipidemia 04/25/2012     Prior to Admission medications   Medication Sig Start Date End Date Taking? Authorizing Provider  aspirin 81 MG tablet Take 81 mg by mouth daily.   Yes Historical Provider, MD  calcium carbonate (OS-CAL) 600 MG TABS Take 600 mg by mouth 2 (two) times daily with a meal.   Yes Historical Provider, MD  Cholecalciferol (VITAMIN D PO) Take 1 tablet by mouth 2 (two) times daily.   Yes Historical Provider, MD  co-enzyme Q-10 50 MG capsule Take 50 mg by mouth daily.   Yes Historical Provider, MD  GLUCOSAMINE-CHONDROITIN PO Take 1 tablet by mouth 2 (two) times daily.   Yes Historical Provider,  MD  LORazepam (ATIVAN) 2 MG tablet take 1/2 to 1 at bedtime if needed for sleep 01/24/16  Yes Rubye Strohmeyer, PA-C  mineral oil liquid Gently place mineral oil soaked cotton in the both ears weekly and leave in for 20 minutes. 02/07/16  Yes Tereasa Coop, PA-C  Multiple Vitamins-Minerals (MULTIVITAMIN PO) Take by mouth daily.   Yes Historical Provider, MD  Omega-3 Fatty Acids (FISH OIL PO) Take by mouth 2 (two) times daily.   Yes Historical Provider, MD  sertraline (ZOLOFT) 25 MG tablet Take 1 tablet (25 mg total) by mouth daily. 03/16/15  Yes Elby Beck, FNP     No Known Allergies     Objective:  Physical Exam  Constitutional: She is oriented to person, place, and time. She appears well-developed and well-nourished. She is active and cooperative. No distress.  BP 120/80 (BP Location: Right Arm, Patient Position: Sitting, Cuff Size: Large)   Pulse (!) 101   Temp 98.4 F (36.9 C) (Oral)   Resp 16   Ht 5\' 2"  (1.575 m)   Wt 229 lb 12.8 oz (104.2 kg)   SpO2 97%   BMI 42.03 kg/m    Eyes: Conjunctivae are normal.  Pulmonary/Chest: Effort normal.  Neurological: She is alert and oriented to person, place, and time.  Psychiatric: She has a normal mood and affect. Her speech is normal and  behavior is normal.           Assessment & Plan:   1. Insomnia, persistent #30 tablets will last her through her trip and to her visit with me in October. - LORazepam (ATIVAN) 2 MG tablet; take 1/2 to 1 at bedtime if needed for sleep  Dispense: 30 tablet; Refill: 0   Fara Chute, PA-C Physician Assistant-Certified [facility]

## 2016-05-02 ENCOUNTER — Encounter: Payer: Self-pay | Admitting: Physician Assistant

## 2016-05-02 ENCOUNTER — Ambulatory Visit (INDEPENDENT_AMBULATORY_CARE_PROVIDER_SITE_OTHER): Payer: Medicare HMO | Admitting: Physician Assistant

## 2016-05-02 VITALS — BP 128/70 | HR 93 | Temp 97.5°F | Resp 18 | Ht 62.0 in | Wt 226.2 lb

## 2016-05-02 DIAGNOSIS — Z77011 Contact with and (suspected) exposure to lead: Secondary | ICD-10-CM

## 2016-05-02 DIAGNOSIS — Z1159 Encounter for screening for other viral diseases: Secondary | ICD-10-CM | POA: Diagnosis not present

## 2016-05-02 DIAGNOSIS — Z0000 Encounter for general adult medical examination without abnormal findings: Secondary | ICD-10-CM | POA: Diagnosis not present

## 2016-05-02 DIAGNOSIS — R82998 Other abnormal findings in urine: Secondary | ICD-10-CM

## 2016-05-02 DIAGNOSIS — R8299 Other abnormal findings in urine: Secondary | ICD-10-CM | POA: Diagnosis not present

## 2016-05-02 DIAGNOSIS — Z1389 Encounter for screening for other disorder: Secondary | ICD-10-CM | POA: Diagnosis not present

## 2016-05-02 DIAGNOSIS — Z130 Encounter for screening for diseases of the blood and blood-forming organs and certain disorders involving the immune mechanism: Secondary | ICD-10-CM

## 2016-05-02 DIAGNOSIS — Z1329 Encounter for screening for other suspected endocrine disorder: Secondary | ICD-10-CM | POA: Diagnosis not present

## 2016-05-02 DIAGNOSIS — Z23 Encounter for immunization: Secondary | ICD-10-CM

## 2016-05-02 DIAGNOSIS — E2839 Other primary ovarian failure: Secondary | ICD-10-CM

## 2016-05-02 DIAGNOSIS — E785 Hyperlipidemia, unspecified: Secondary | ICD-10-CM | POA: Diagnosis not present

## 2016-05-02 LAB — POCT URINALYSIS DIP (MANUAL ENTRY)
Bilirubin, UA: NEGATIVE
Blood, UA: NEGATIVE
GLUCOSE UA: NEGATIVE
Ketones, POC UA: NEGATIVE
NITRITE UA: NEGATIVE
PROTEIN UA: NEGATIVE
Spec Grav, UA: 1.02
UROBILINOGEN UA: 0.2
pH, UA: 5.5

## 2016-05-02 LAB — LIPID PANEL
CHOL/HDL RATIO: 6.5 ratio — AB (ref <=5.0)
CHOLESTEROL: 272 mg/dL — AB (ref 125–200)
HDL: 42 mg/dL — ABNORMAL LOW (ref >=46)
LDL Cholesterol: 194 mg/dL — ABNORMAL HIGH (ref <130)
TRIGLYCERIDES: 180 mg/dL — AB (ref <150)
VLDL: 36 mg/dL — ABNORMAL HIGH (ref <30)

## 2016-05-02 LAB — URINALYSIS, MICROSCOPIC ONLY
CRYSTALS: NONE SEEN [HPF]
Casts: NONE SEEN [LPF]
RBC / HPF: NONE SEEN RBC/HPF (ref <=2)
YEAST: NONE SEEN [HPF]

## 2016-05-02 LAB — CBC WITH DIFFERENTIAL/PLATELET
BASOS PCT: 1 %
Basophils Absolute: 49 cells/uL (ref 0–200)
Eosinophils Absolute: 245 cells/uL (ref 15–500)
Eosinophils Relative: 5 %
HEMATOCRIT: 45.6 % — AB (ref 35.0–45.0)
HEMOGLOBIN: 15.5 g/dL (ref 11.7–15.5)
LYMPHS ABS: 2009 {cells}/uL (ref 850–3900)
Lymphocytes Relative: 41 %
MCH: 30.5 pg (ref 27.0–33.0)
MCHC: 34 g/dL (ref 32.0–36.0)
MCV: 89.6 fL (ref 80.0–100.0)
MONO ABS: 343 {cells}/uL (ref 200–950)
MPV: 9.1 fL (ref 7.5–12.5)
Monocytes Relative: 7 %
NEUTROS ABS: 2254 {cells}/uL (ref 1500–7800)
NEUTROS PCT: 46 %
Platelets: 220 10*3/uL (ref 140–400)
RBC: 5.09 MIL/uL (ref 3.80–5.10)
RDW: 13.6 % (ref 11.0–15.0)
WBC: 4.9 10*3/uL (ref 3.8–10.8)

## 2016-05-02 LAB — COMPREHENSIVE METABOLIC PANEL
ALBUMIN: 4.3 g/dL (ref 3.6–5.1)
ALK PHOS: 121 U/L (ref 33–130)
ALT: 17 U/L (ref 6–29)
AST: 20 U/L (ref 10–35)
BILIRUBIN TOTAL: 0.7 mg/dL (ref 0.2–1.2)
BUN: 16 mg/dL (ref 7–25)
CO2: 26 mmol/L (ref 20–31)
CREATININE: 0.9 mg/dL (ref 0.50–0.99)
Calcium: 9.5 mg/dL (ref 8.6–10.4)
Chloride: 104 mmol/L (ref 98–110)
GLUCOSE: 87 mg/dL (ref 65–99)
Potassium: 4.2 mmol/L (ref 3.5–5.3)
SODIUM: 140 mmol/L (ref 135–146)
Total Protein: 7.3 g/dL (ref 6.1–8.1)

## 2016-05-02 LAB — TSH: TSH: 2.04 mIU/L

## 2016-05-02 LAB — HEPATITIS C ANTIBODY: HCV AB: NEGATIVE

## 2016-05-02 NOTE — Patient Instructions (Addendum)
   IF you received an x-ray today, you will receive an invoice from [facility]. Please contact [facility] at [phone] with questions or concerns regarding your invoice.   IF you received labwork today, you will receive an invoice from [facility]. Please contact [facility] at [phone] with questions or concerns regarding your invoice.   Our billing staff will not be able to assist you with questions regarding bills from these companies.  You will be contacted with the lab results as soon as they are available. The fastest way to get your results is to activate your My Chart account. Instructions are located on the last page of this paperwork. If you have not heard from us regarding the results in 2 weeks, please contact this office.    Keeping You Healthy  Get These Tests  Blood Pressure- Have your blood pressure checked by your healthcare provider at least once a year.  Normal blood pressure is 120/80.  Weight- Have your body mass index (BMI) calculated to screen for obesity.  BMI is a measure of body fat based on height and weight.  You can calculate your own BMI at www.nhlbisupport.com/bmi/  Cholesterol- Have your cholesterol checked every year.  Diabetes- Have your blood sugar checked every year if you have high blood pressure, high cholesterol, a family history of diabetes or if you are overweight.  Pap Test - Have a pap test every 1 to 5 years if you have been sexually active.  If you are older than 65 and recent pap tests have been normal you may not need additional pap tests.  In addition, if you have had a hysterectomy  for benign disease additional pap tests are not necessary.  Mammogram-Yearly mammograms are essential for early detection of breast cancer  Screening for Colon Cancer- Colonoscopy starting at age 50. Screening may begin sooner depending on your family history and other health conditions.  Follow up colonoscopy  as directed by your Gastroenterologist.  Screening for Osteoporosis- Screening begins at age 65 with bone density scanning, sooner if you are at higher risk for developing Osteoporosis.  Get these medicines  Calcium with Vitamin D- Your body requires 1200-1500 mg of Calcium a day and 800-1000 IU of Vitamin D a day.  You can only absorb 500 mg of Calcium at a time therefore Calcium must be taken in 2 or 3 separate doses throughout the day.  Hormones- Hormone therapy has been associated with increased risk for certain cancers and heart disease.  Talk to your healthcare provider about if you need relief from menopausal symptoms.  Aspirin- Ask your healthcare provider about taking Aspirin to prevent Heart Disease and Stroke.  Get these Immuniztions  Flu shot- Every fall  Pneumonia shot- Once after the age of 65; if you are younger ask your healthcare provider if you need a pneumonia shot.  Tetanus- Every ten years.  Zostavax- Once after the age of 60 to prevent shingles.  Take these steps  Don't smoke- Your healthcare provider can help you quit. For tips on how to quit, ask your healthcare provider or go to www.smokefree.gov or call 1-800 QUIT-NOW.  Be physically active- Exercise 5 days a week for a minimum of 30 minutes.  If you are not already physically active, start slow and gradually work up to 30 minutes of moderate physical activity.  Try walking, dancing, bike riding, swimming, etc.  Eat a healthy diet- Eat a variety of healthy foods such as fruits, vegetables, whole   grains, low fat milk, low fat cheeses, yogurt, lean meats, chicken, fish, eggs, dried beans, tofu, etc.  For more information go to www.thenutritionsource.org  Dental visit- Brush and floss teeth twice daily; visit your dentist twice a year.  Eye exam- Visit your Optometrist or Ophthalmologist yearly.  Drink alcohol in moderation- Limit alcohol intake to one drink or less a day.  Never drink and  drive.  Depression- Your emotional health is as important as your physical health.  If you're feeling down or losing interest in things you normally enjoy, please talk to your healthcare provider.  Seat Belts- can save your life; always wear one  Smoke/Carbon Monoxide detectors- These detectors need to be installed on the appropriate level of your home.  Replace batteries at least once a year.  Violence- If anyone is threatening or hurting you, please tell your healthcare provider.  Living Will/ Health care power of attorney- Discuss with your healthcare provider and family. 

## 2016-05-02 NOTE — Progress Notes (Signed)
Presents today for [employer] Visit-Subsequent.   Date of last exam: last visit here 02/2016, last wellness exam 02/2015  Interpreter used for this visit? no  Patient Care Team: Harrison Mons, PA-C as PCP - General (Family Medicine)   Other items to address today: none   Cancer Screening: Cervical: yes, reportedly annually until 3 years ago. Will review paper record for confirmation. If all normal, no further screening needed. Breast: yes, 06/2015 Colon: yes, 10/2015, repeat 2022  Prostate: n/a   Other Screening: Last screening for diabetes: 02/2015 Last lipid screening: 02/2015-patient has hyperlipidemia   ADVANCE DIRECTIVES: Discussed: yes On File: no; asked her to bring these for scanning Materials Provided: no   Immunization status: up to date and documented, missing doses of Prevnar-13 and Pneumovax 23.  Home Environment: lives alone, as her husband lives in another home nearby. Close relationship with their children.    Patient Active Problem List   Diagnosis Date Noted  . Arthritis   . Insomnia, persistent 04/25/2012  . History of anxiety disorder 04/25/2012  . Obesity, Class III, BMI 40-49.9 (morbid obesity) ([place]) 04/25/2012  . Hyperlipidemia 04/25/2012     Past Medical History:  Diagnosis Date  . Anxiety   . Arthritis    RIGHT knee, bone on bone  . Cancer (HCC)    basal cell CA-face  . Hyperlipidemia   . Left ankle injury 12/11/2012     Past Surgical History:  Procedure Laterality Date  . APPENDECTOMY    . BREAST LUMPECTOMY WITH RADIOACTIVE SEED LOCALIZATION Right 01/11/2016   Procedure: RIGHT BREAST LUMPECTOMY WITH RADIOACTIVE SEED LOCALIZATION;  Surgeon: Coralie Keens, MD;  Location: [facility];  Service: General;  Laterality: Right;  RIGHT BREAST LUMPECTOMY WITH RADIOACTIVE SEED LOCALIZATION  . COLONOSCOPY    . GANGLION CYST EXCISION    . KNEE SURGERY  2001   right     Family History  Problem Relation Age of  Onset  . Colon cancer Father 12  . Leukemia Father   . Heart attack Father   . Hyperlipidemia Father   . Hypertension Father   . Colon cancer Maternal Grandmother     DX  AGE 83'S  . Alcohol abuse Maternal Grandmother   . Muscular dystrophy Mother 66  . Alcohol abuse Maternal Grandfather   . Emphysema Maternal Grandfather   . Esophageal cancer Neg Hx   . Stomach cancer Neg Hx   . Rectal cancer Neg Hx   . Diabetes Neg Hx   . Sudden death Neg Hx      Social History   Social History  . Marital status: Married    Spouse name: Clair Gulling  . Number of children: 3  . 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 Smoking status: Never Smoker  . Smokeless tobacco: Never Used  . Alcohol use Yes     Comment: 2-3 a month  . Drug use: No  . Sexual activity: Yes     Comment: number of sex partners in the last 12 months  1   Other Topics Concern  . Not on file   Social History Narrative   Lives with her husband.   They have been together since she was 65 years old. They married in 1971.   She left college after 2 years when they married and moved to [place] where Clair Gulling was stationed in [employer].   Their  3 adult children live in [place], [place], and [place].   One sister lives locally.   Her other sister lives in their hometown on [place], [place].     No Known Allergies   Prior to Admission medications   Medication Sig Start Date End Date Taking? Authorizing Provider  aspirin 81 MG tablet Take 81 mg by mouth daily.    Historical Provider, MD  calcium carbonate (OS-CAL) 600 MG TABS Take 600 mg by mouth 2 (two) times daily with a meal.    Historical Provider, MD  Cholecalciferol (VITAMIN D PO) Take 1 tablet by mouth 2 (two) times daily.    Historical Provider, MD  co-enzyme Q-10 50 MG capsule Take 50 mg by mouth daily.    Historical Provider, MD  GLUCOSAMINE-CHONDROITIN PO Take 1 tablet by mouth 2  (two) times daily.    Historical Provider, MD  LORazepam (ATIVAN) 2 MG tablet take 1/2 to 1 at bedtime if needed for sleep 03/15/16   Harrison Mons, PA-C  mineral oil liquid Gently place mineral oil soaked cotton in the both ears weekly and leave in for 20 minutes. 02/07/16   Tereasa Coop, PA-C  Multiple Vitamins-Minerals (MULTIVITAMIN PO) Take by mouth daily.    Historical Provider, MD  Omega-3 Fatty Acids (FISH OIL PO) Take by mouth 2 (two) times daily.    Historical Provider, MD  sertraline (ZOLOFT) 25 MG tablet Take 1 tablet (25 mg total) by mouth daily. 03/16/15   Elby Beck, FNP     Depression screen [facility] 2/9 05/02/2016 03/15/2016 01/24/2016 11/02/2015 02/24/2014  Decreased Interest 0 0 0 0 0  Down, Depressed, Hopeless 0 0 0 0 0  PHQ - 2 Score 0 0 0 0 0     Fall Risk  05/02/2016 03/15/2016 01/24/2016 11/02/2015 02/24/2014  Falls in the past year? No No No No No     Functional Status Survey: Is the patient deaf or have difficulty hearing?: No Does the patient have difficulty seeing, even when wearing glasses/contacts?: No Does the patient have difficulty concentrating, remembering, or making decisions?: No Does the patient have difficulty walking or climbing stairs?: No Does the patient have difficulty dressing or bathing?: No Does the patient have difficulty doing errands alone such as visiting a doctor's office or shopping?: No     Evaluation of Cognitive Function: Mood/affect: good/bright Appearance: well-groomed Family Member/caregiver input: none    PHYSICAL EXAM: BP 128/70   Pulse 93   Temp 97.5 F (36.4 C) (Oral)   Resp 18   Ht 5\' 2"  (1.575 m)   Wt 226 lb 3.2 oz (102.6 kg)   SpO2 95%   BMI 41.37 kg/m    Wt Readings from Last 3 Encounters:  05/02/16 226 lb 3.2 oz (102.6 kg)  03/15/16 229 lb 12.8 oz (104.2 kg)  02/07/16 228 lb (103.4 kg)       Visual Acuity Screening   Right eye Left eye Both eyes  Without correction: 20/25 20/20 20/20   With  correction:         Physical Exam  Constitutional: She is oriented to person, place, and time. She appears well-developed and well-nourished. She is active and cooperative. No distress.  HENT:  Head: Normocephalic and atraumatic.  Right Ear: Hearing, tympanic membrane, external ear and ear canal normal.  Left Ear: Hearing, tympanic membrane, external ear and ear canal normal.  Nose: Nose normal.  Mouth/Throat: Uvula is midline, oropharynx is clear and moist and mucous membranes are normal.  No oral lesions. No uvula swelling. No oropharyngeal exudate.  Eyes: Conjunctivae, EOM and lids are normal. Pupils are equal, round, and reactive to light. Right eye exhibits no discharge. Left eye exhibits no discharge. No scleral icterus.  Fundoscopic exam:      The right eye shows no hemorrhage and no papilledema. The right eye shows red reflex.       The left eye shows no hemorrhage and no papilledema. The left eye shows red reflex.  Neck: Normal range of motion, full passive range of motion without pain and phonation normal. Neck supple. No thyromegaly present.  Cardiovascular: Normal rate, regular rhythm, normal heart sounds and intact distal pulses.  Exam reveals no gallop and no friction rub.   No murmur heard. Respiratory: Effort normal and breath sounds normal. Right breast exhibits no inverted nipple, no mass, no nipple discharge, no skin change and no tenderness. Left breast exhibits no inverted nipple, no mass, no nipple discharge, no skin change and no tenderness. Breasts are symmetrical.  GI: Soft. Normal appearance and bowel sounds are normal. There is no hepatosplenomegaly. There is no tenderness.  Musculoskeletal:       Cervical back: Normal.       Thoracic back: Normal.       Lumbar back: Normal.  Lymphadenopathy:       Head (right side): No submandibular and no tonsillar adenopathy present.       Head (left side): No submandibular and no tonsillar adenopathy present.    She has no  cervical adenopathy.       Right: No supraclavicular adenopathy present.       Left: No supraclavicular adenopathy present.  Neurological: She is alert and oriented to person, place, and time. She has normal strength. No cranial nerve deficit or sensory deficit.  Skin: Skin is warm and dry. No rash noted. She is not diaphoretic.  Psychiatric: She has a normal mood and affect. Her speech is normal and behavior is normal. Judgment and thought content normal. Cognition and memory are normal.         Education/Counseling: yes diet and exercise yes prevention of chronic diseases yes smoking/tobacco cessation yes review "Covered Medicare Preventive Services"    ASSESSMENT/PLAN:  1. Medicare annual wellness visit, subsequent Age appropriate anticipatory guidance provided.  2. Flu vaccine need - Flu Vaccine QUAD 36+ mos IM  3. Screening for deficiency anemia - CBC with Differential/Platelet  4. Screening for blood or protein in urine - POCT urinalysis dipstick - Urinalysis, microscopic only  5. Screening for hypothyroidism - TSH  6. Need for hepatitis C screening test - Hepatitis C antibody  7. Hyperlipidemia, unspecified hyperlipidemia type Await labs. Adjust regimen as indicated by results. - Lipid panel - Comprehensive metabolic panel  8. Estrogen deficiency - DG Bone Density; Future  9. Need for pneumococcal vaccination She has already received the influenza vaccine today, so recommend that she return in 1 month for the Prevnar-13, and then 6 months later for the Pneumovax 23. - Pneumococcal conjugate vaccine 13-valent IM; Future - Pneumococcal polysaccharide vaccine 23-valent greater than or equal to 2yo subcutaneous/IM; Future  Paper chart obtained and reviewed. Only cervical cancer screening result is from 02/2004, and cytology was normal. The record also indicates that in 2002 she was receiving GYN care at [facility] (called-they have no records of her being  there) and the patient reported in 03/2011 that she had had a pap test with Dr. Cletis Media in 12/2010 (called-negative cytology 01/2011). Per the  CDC and USPSTF, adequate prior screening is 3 consecutive negative cytology results or 2 consecutive negative HPV results within 10 years before cessation of screening, with the most recent test occurring within 5 years. As such, I would recommend one more screening prior to cessation.    Fara Chute, PA-C Physician Assistant-Certified [facility]

## 2016-05-02 NOTE — Progress Notes (Signed)
Subjective:    Patient ID: Deanna Allen, female    DOB: 09/09/1950, 65 y.o.   MRN: [id number] Chief Complaint  Patient presents with  . Annual Exam    CPE    HPI  Last wellness visit was 03/16/2015  Patient Care Team: Harrison Mons, PA-C as PCP - General (Family Medicine)   Cancer screen: Cervical: 3 years ago  Breast: March 2017 Prostate: NA Colon: 5 years ago, due this year due to fam hx   Vaccinations: Up to date, missing Prevnar 13 and Pneumovax vaccine   Advanced Directives: Reports having advanced directives but not on file, patient reports she will bring them in during next visit.  Silver sneaker for exercise 45 min a day most days Diet is "okay" somedays are worse than others  Here for annual wellness visit, reports she is generally in good health Hasn't had anxiety attack in years, but still admits to generalized worry about life stressors   Past Medical History:  Diagnosis Date  . Anxiety   . Arthritis    RIGHT knee, bone on bone  . Cancer (HCC)    basal cell CA-face  . Hyperlipidemia   . Left ankle injury 12/11/2012   Family History  Problem Relation Age of Onset  . Colon cancer Father 41  . Leukemia Father   . Heart attack Father   . Hyperlipidemia Father   . Hypertension Father   . Colon cancer Maternal Grandmother     DX  AGE 55'S  . Alcohol abuse Maternal Grandmother   . Muscular dystrophy Mother 72  . Alcohol abuse Maternal Grandfather   . Emphysema Maternal Grandfather   . Asthma Son   . Esophageal cancer Neg Hx   . Stomach cancer Neg Hx   . Rectal cancer Neg Hx   . Diabetes Neg Hx   . Sudden death Neg Hx    Social History   Social History  . Marital status: Married    Spouse name: Clair Gulling  . Number of children: 3  . 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 Smoking status: Never Smoker  . Smokeless tobacco: Never Used  . Alcohol use Yes     Comment: 2-3  a month  . Drug use: No  . Sexual activity: Yes     Comment: number of sex partners in the last 12 months  1   Other Topics Concern  . Not on file   Social History Narrative   Lives with her husband.   They have been together since she was 65 years old. They married in 1971.   She left college after 2 years when they married and moved to [place] where Clair [place]ulling was stationed in [employer].   Their 3 adult children live in [place], [place], and [place].   One sister lives locally.   Her other sister lives in their hometown on [place], [place].   Current Outpatient Prescriptions on File Prior to Visit  Medication Sig Dispense Refill  . aspirin 81 MG tablet Take 81 mg by mouth daily.    . calcium carbonate (OS-CAL) 600 MG TABS Take 600 mg by mouth 2 (two) times daily with a meal.    . Cholecalciferol (VITAMIN D PO) Take 1 tablet by mouth 2 (two) times daily.    Marland Kitchen co-enzyme Q-10 50 MG capsule Take 50 mg by mouth daily.    Marland Kitchen GLUCOSAMINE-CHONDROITIN  PO Take 1 tablet by mouth 2 (two) times daily.    Marland Kitchen LORazepam (ATIVAN) 2 MG tablet take 1/2 to 1 at bedtime if needed for sleep 30 tablet 0  . mineral oil liquid Gently place mineral oil soaked cotton in the both ears weekly and leave in for 20 minutes. 180 mL 0  . Multiple Vitamins-Minerals (MULTIVITAMIN PO) Take by mouth daily.    . Omega-3 Fatty Acids (FISH OIL PO) Take by mouth 2 (two) times daily.    . sertraline (ZOLOFT) 25 MG tablet Take 1 tablet (25 mg total) by mouth daily. 90 tablet 1   No current facility-administered medications on file prior to visit.     Review of Systems All ROS are negative except  thos stated above     Objective:   Physical Exam  Constitutional: She appears well-developed and well-nourished. No distress.  HENT:  Head: Normocephalic and atraumatic.  Right Ear: External ear normal.  Left Ear: External ear normal.  Mouth/Throat: Oropharynx is clear and moist. No oropharyngeal exudate.  Eyes:  Pupils are equal, round, and reactive to light. Right eye exhibits no discharge. Left eye exhibits no discharge.  Neck: Neck supple. No thyromegaly present.  Cardiovascular: Normal rate, regular rhythm, normal heart sounds and intact distal pulses.  Exam reveals no gallop and no friction rub.   No murmur heard. Pulmonary/Chest: Effort normal and breath sounds normal. No respiratory distress. She has no wheezes.  Abdominal: Soft. Bowel sounds are normal. There is no tenderness.  Musculoskeletal: She exhibits no edema or tenderness.  Lymphadenopathy:    She has no cervical adenopathy.  Neurological: She is alert. She displays normal reflexes.  Skin: Skin is warm and dry. No rash noted. She is not diaphoretic.  Psychiatric: She has a normal mood and affect. Her behavior is normal.   BP 128/70   Pulse 93   Temp 97.5 F (36.4 C) (Oral)   Resp 18   Ht 5\' 2"  (1.575 m)   Wt 226 lb 3.2 oz (102.6 kg)   SpO2 95%   BMI 41.37 kg/m       Assessment & Plan:  1. Medicare annual wellness visit, subsequent Anticipatory guidance given   2. Flu vaccine need - Flu Vaccine QUAD 36+ mos IM  3. Screening for deficiency anemia - CBC with Differential/Platelet  4. Screening for blood or protein in urine - POCT urinalysis dipstick - Urinalysis, microscopic only  5. Screening for hypothyroidism - TSH  6. Need for hepatitis C screening tes - Hepatitis C antibody  7. Hyperlipidemia, unspecified hyperlipidemia type Recent labs show a downward trend, await labs for further guidance  - Lipid panel - Comprehensive metabolic panel  8. Estrogen deficiency - DG Bone Density; Future  9. Need for pneumococcal vaccination - Pneumococcal conjugate vaccine 13-valent IM; Future - Pneumococcal polysaccharide vaccine 23-valent greater than or equal to 2yo subcutaneous/IM; Future  10. Urine WBC increased - Urine culture

## 2016-05-03 ENCOUNTER — Encounter: Payer: Self-pay | Admitting: Physician Assistant

## 2016-05-03 LAB — URINE CULTURE

## 2016-05-03 MED ORDER — ATORVASTATIN CALCIUM 20 MG PO TABS: 20.0000 mg | ORAL_TABLET | Freq: Every day | ORAL | 3 refills | Status: DC

## 2016-05-03 NOTE — Addendum Note (Signed)
Addended by: Fara Chute on: 05/03/2016 03:24 PM   Modules accepted: Orders

## 2016-05-08 ENCOUNTER — Other Ambulatory Visit: Payer: Self-pay | Admitting: Physician Assistant

## 2016-05-08 ENCOUNTER — Ambulatory Visit (INDEPENDENT_AMBULATORY_CARE_PROVIDER_SITE_OTHER): Payer: Medicare HMO | Admitting: Physician Assistant

## 2016-05-08 VITALS — BP 130/80 | HR 92 | Temp 97.8°F | Resp 18 | Ht 62.0 in | Wt 229.8 lb

## 2016-05-08 DIAGNOSIS — G4700 Insomnia, unspecified: Secondary | ICD-10-CM

## 2016-05-08 DIAGNOSIS — H6123 Impacted cerumen, bilateral: Secondary | ICD-10-CM | POA: Diagnosis not present

## 2016-05-08 MED ORDER — LORAZEPAM 2 MG PO TABS: ORAL_TABLET | ORAL | 0 refills | Status: DC

## 2016-05-08 NOTE — Progress Notes (Signed)
Subjective:     Patient ID: Deanna Allen, female   DOB: [birth year], 65 y.o.   MRN: [id number]  Chief Complaint  Patient presents with  . Cerumen Impaction    bilateral, left worsen than right     HPI: Deanna Allen is a 65 year old female who presents for cerumen impaction which both patient and clinician forgot to address at annual exam last week. States this has been an ongoing problem increasing the past few years. Has tried to use mineral oil with cotton balls at home and states it softens the ear cerumen but then it never comes out. Woke up this morning unable to hear very well due to impaction, particularly in left ear. States she occasionally uses Q-tips only at the very edge of the ear to get out any cerumen at the edge of the space.  Review of Systems  All other systems reviewed and are negative.     Objective:   Physical Exam  HENT:  Right and left ear both with cerumen impaction upon initial otoscope examination.   : CMA performed saline ear irrigation. Inspection after saline ear irrigation revealed pearly gray tympanic membranes with visible landmarks. No erythema or bulging. Patient states her hearing also completely improved after irrigation.      Assessment & Plan Dx: Cerumen Impaction  Discussed discontinuing use of Q-tips as it can exacerbate the cerumen impaction and continue home remedies to soften ear wax. Instructed to return to office if symptoms recur and need to have irrigation completed again.

## 2016-05-08 NOTE — Telephone Encounter (Signed)
Patient does not want to take the statin.   Wants to talk with Chelle regarding the prescription.  [place]

## 2016-05-08 NOTE — Progress Notes (Signed)
Chief Complaint  Patient presents with  . Cerumen Impaction    bilateral, left worsen than right     History of Present Illness: Patient presents for irrigation of the cerumen in both ears.  We discussed this briefly at her visit recently, but then did not proceed with the irrigation. Since then, she has developed reduced hearing bilaterally, and would like to go ahead with wax removal.   No Known Allergies  Prior to Admission medications   Medication Sig Start Date End Date Taking? Authorizing Provider  aspirin 81 MG tablet Take 81 mg by mouth daily.   Yes Historical Provider, MD  atorvastatin (LIPITOR) 20 MG tablet Take 1 tablet (20 mg total) by mouth daily. 05/03/16  Yes Kreig Parson, PA-C  calcium carbonate (OS-CAL) 600 MG TABS Take 600 mg by mouth 2 (two) times daily with a meal.   Yes Historical Provider, MD  Cholecalciferol (VITAMIN D PO) Take 1 tablet by mouth 2 (two) times daily.   Yes Historical Provider, MD  co-enzyme Q-10 50 MG capsule Take 50 mg by mouth daily.   Yes Historical Provider, MD  GLUCOSAMINE-CHONDROITIN PO Take 1 tablet by mouth 2 (two) times daily.   Yes Historical Provider, MD  LORazepam (ATIVAN) 2 MG tablet take 1/2 to 1 at bedtime if needed for sleep 03/15/16  Yes Tikita Mabee, PA-C  mineral oil liquid Gently place mineral oil soaked cotton in the both ears weekly and leave in for 20 minutes. 02/07/16  Yes Tereasa Coop, PA-C  Multiple Vitamins-Minerals (MULTIVITAMIN PO) Take by mouth daily.   Yes Historical Provider, MD  Omega-3 Fatty Acids (FISH OIL PO) Take by mouth 2 (two) times daily.   Yes Historical Provider, MD  sertraline (ZOLOFT) 25 MG tablet Take 1 tablet (25 mg total) by mouth daily. 03/16/15  Yes Elby Beck, FNP    Patient Active Problem List   Diagnosis Date Noted  . 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 Arthritis   . Insomnia, persistent 04/25/2012  . History of anxiety disorder 04/25/2012  . Obesity, Class III, BMI 40-49.9 (morbid  obesity) ([place]) 04/25/2012  . Hyperlipidemia 04/25/2012     Physical Exam  Constitutional: She is oriented to person, place, and time. She appears well-developed and well-nourished. She is active and cooperative. No distress.  BP 130/80 (BP Location: Right Arm, Patient Position: Sitting, Cuff Size: Normal)   Pulse 92   Temp 97.8 F (36.6 C) (Oral)   Resp 18   Ht 5\' 2"  (1.575 m)   Wt 229 lb 12.8 oz (104.2 kg)   SpO2 97%   BMI 42.03 kg/m    HENT:  Head: Normocephalic and atraumatic.  Both ears with cerumen impaction, completely blocking both canals. Irrigation performed, completely resolving the impactions and returning hearing to normal subjectively. Both canals and TMs are normal following irrigation.  Eyes: Conjunctivae and lids are normal.  Pulmonary/Chest: Effort normal.  Neurological: She is alert and oriented to person, place, and time.  Psychiatric: She has a normal mood and affect. Her speech is normal and behavior is normal.      ASSESSMENT & PLAN:  1. Bilateral impacted cerumen Resolved after irrigation.  2. Insomnia, persistent Stable. She continues to work to reduce the dose and frequency of lorazepam use. - LORazepam (ATIVAN) 2 MG tablet; take 1/2 to 1 at bedtime if needed for sleep  Dispense: 30 tablet; Refill: 0   Fara Chute, PA-C Physician Assistant-Certified [facility]

## 2016-05-08 NOTE — Patient Instructions (Signed)
     IF you received an x-ray today, you will receive an invoice from [facility]. Please contact [facility] at [phone] with questions or concerns regarding your invoice.   IF you received labwork today, you will receive an invoice from [facility]. Please contact [facility] at [phone] with questions or concerns regarding your invoice.   Our billing staff will not be able to assist you with questions regarding bills from these companies.  You will be contacted with the lab results as soon as they are available. The fastest way to get your results is to activate your My Chart account. Instructions are located on the last page of this paperwork. If you have not heard from us regarding the results in 2 weeks, please contact this office.      

## 2016-05-09 DIAGNOSIS — R69 Illness, unspecified: Secondary | ICD-10-CM | POA: Diagnosis not present

## 2016-06-09 ENCOUNTER — Telehealth: Payer: Self-pay

## 2016-06-09 NOTE — Telephone Encounter (Signed)
Patient is requesting to be changed to a generic version of Zetia.  Please advise.

## 2016-06-09 NOTE — Telephone Encounter (Signed)
Pt is wanting to let chelle know that she does not want to be on a statin  But is wanting to get a 90 day supply of the generic zetia which is ezetimibe  At [employer]

## 2016-06-10 MED ORDER — EZETIMIBE 10 MG PO TABS: 10.0000 mg | ORAL_TABLET | Freq: Every day | ORAL | 3 refills | Status: DC

## 2016-06-10 NOTE — Telephone Encounter (Signed)
Meds ordered this encounter  Medications  . ezetimibe (ZETIA) 10 MG tablet    Sig: Take 1 tablet (10 mg total) by mouth daily.    Dispense:  90 tablet    Refill:  3    Order Specific Question:   Supervising Provider    Answer:   Brigitte Pulse, EVA N [[id number]]

## 2016-06-14 NOTE — Telephone Encounter (Signed)
LMOM advising of Rx for generic being sent if she was not already aware.

## 2016-07-01 ENCOUNTER — Telehealth: Payer: Self-pay | Admitting: Physician Assistant

## 2016-07-01 DIAGNOSIS — G4700 Insomnia, unspecified: Secondary | ICD-10-CM

## 2016-07-01 NOTE — Telephone Encounter (Signed)
Last visit with you was 05/08/16 for insomnia you wanted her to continue to reduce this medication   Ok to refill Lorazepam?

## 2016-07-01 NOTE — Telephone Encounter (Signed)
Meds ordered this encounter  Medications  . LORazepam (ATIVAN) 2 MG tablet    Sig: TAKE 1/2 TO 1 TABLET BY MOUTH AT BEDTIME IF NEEDED FOR SLEEP    Dispense:  30 tablet    Refill:  0    

## 2016-07-04 NOTE — Telephone Encounter (Signed)
Checking on refill status.  Needs to be faxed to [facility]

## 2016-07-06 NOTE — Telephone Encounter (Signed)
Sent 12/09 by Domingo Mend

## 2016-07-07 NOTE — Telephone Encounter (Signed)
Called pharm to make sure it was faxed and they had not received it. Called it in and they will notify pt when ready.

## 2016-08-01 DIAGNOSIS — R69 Illness, unspecified: Secondary | ICD-10-CM | POA: Diagnosis not present

## 2016-08-15 ENCOUNTER — Other Ambulatory Visit: Payer: Self-pay | Admitting: Family Medicine

## 2016-08-15 DIAGNOSIS — F419 Anxiety disorder, unspecified: Secondary | ICD-10-CM

## 2016-08-17 DIAGNOSIS — M25561 Pain in right knee: Secondary | ICD-10-CM | POA: Diagnosis not present

## 2016-08-17 DIAGNOSIS — M1711 Unilateral primary osteoarthritis, right knee: Secondary | ICD-10-CM | POA: Diagnosis not present

## 2016-08-17 DIAGNOSIS — M21161 Varus deformity, not elsewhere classified, right knee: Secondary | ICD-10-CM | POA: Diagnosis not present

## 2016-08-18 ENCOUNTER — Other Ambulatory Visit: Payer: Self-pay | Admitting: Physician Assistant

## 2016-08-18 DIAGNOSIS — F419 Anxiety disorder, unspecified: Secondary | ICD-10-CM

## 2016-08-18 NOTE — Telephone Encounter (Signed)
Patient needs her sertraline (ZOLOFT) 25 MG tablet at the  [facility] at [place] [facility]

## 2016-08-18 NOTE — Telephone Encounter (Signed)
Meds ordered this encounter  Medications  . sertraline (ZOLOFT) 25 MG tablet    Sig: take 1 tablet by mouth once daily    Dispense:  90 tablet    Refill:  1

## 2016-08-31 ENCOUNTER — Other Ambulatory Visit: Payer: Self-pay | Admitting: Physician Assistant

## 2016-08-31 ENCOUNTER — Encounter: Payer: Self-pay | Admitting: Gastroenterology

## 2016-08-31 DIAGNOSIS — G4700 Insomnia, unspecified: Secondary | ICD-10-CM

## 2016-09-02 NOTE — Telephone Encounter (Signed)
06/30/16 last refill 04/2016 last ov

## 2016-09-04 NOTE — Telephone Encounter (Signed)
Meds ordered this encounter  Medications  . LORazepam (ATIVAN) 2 MG tablet    Sig: TAKE 1/2 TO 1 TABLET BY MOUTH AT BEDTIME AS NEEDED FOR SLEEP    Dispense:  30 tablet    Refill:  0

## 2016-09-04 NOTE — Telephone Encounter (Signed)
Faxed to [facility]

## 2016-09-14 ENCOUNTER — Telehealth: Payer: Self-pay

## 2016-09-14 NOTE — Telephone Encounter (Signed)
Pt is returning our call.  I'm unsure as to who contacted her earlier, I couldn't see anything in her chart.  Please advise  [phone]

## 2016-09-22 ENCOUNTER — Telehealth: Payer: Self-pay | Admitting: Physician Assistant

## 2016-09-22 NOTE — Telephone Encounter (Signed)
FYI

## 2016-09-22 NOTE — Telephone Encounter (Signed)
Patient is scheduled for 3/6 with Deanna Allen at 10am

## 2016-09-22 NOTE — Telephone Encounter (Signed)
Please schedule this patient for a visit with me for surgical clearance. We'll need to update her EKG (last done in 2013) and CBC and kidney function tests. She is also due to update her cholesterol, so I recommend that she FAST.

## 2016-09-22 NOTE — Telephone Encounter (Signed)
Pt dropped off surgical clearance paper work, put in Deanna Allen

## 2016-09-25 NOTE — Telephone Encounter (Signed)
Form is in my box in the provider lounge.

## 2016-09-26 ENCOUNTER — Ambulatory Visit (INDEPENDENT_AMBULATORY_CARE_PROVIDER_SITE_OTHER): Payer: Medicare HMO | Admitting: Physician Assistant

## 2016-09-26 ENCOUNTER — Encounter: Payer: Self-pay | Admitting: Physician Assistant

## 2016-09-26 VITALS — BP 112/73 | HR 80 | Temp 98.0°F | Resp 18 | Ht 62.0 in | Wt 215.0 lb

## 2016-09-26 DIAGNOSIS — Z01818 Encounter for other preprocedural examination: Secondary | ICD-10-CM | POA: Diagnosis not present

## 2016-09-26 DIAGNOSIS — M1711 Unilateral primary osteoarthritis, right knee: Secondary | ICD-10-CM

## 2016-09-26 DIAGNOSIS — E785 Hyperlipidemia, unspecified: Secondary | ICD-10-CM

## 2016-09-26 NOTE — Progress Notes (Signed)
     Patient ID: Deanna Allen, female    DOB: 01/28/1951, 66 y.o.   MRN: [id number]  PCP: Harrison Mons, PA-C  No chief complaint on file.   Subjective:   Presents for presurgical evaluation.   Able to walk up a flight of  Without issue. Does silver sneakers 5 days a week. Denies swelling in ankles or legs, syncope, lightheaded, papitations, SOB, chest pain.   Review of Systems  Musculoskeletal: Positive for arthralgias.  All other systems reviewed and are negative.      Patient Active Problem List   Diagnosis Date Noted  . 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 Arthritis   . Insomnia, persistent 04/25/2012  . History of anxiety disorder 04/25/2012  . Obesity, Class III, BMI 40-49.9 (morbid obesity) ([place]) 04/25/2012  . Hyperlipidemia 04/25/2012     Prior to Admission medications   Medication Sig Start Date End Date Taking? Authorizing Provider  aspirin 81 MG tablet Take 81 mg by mouth daily.    Historical Provider, MD  calcium carbonate (OS-CAL) 600 MG TABS Take 600 mg by mouth 2 (two) times daily with a meal.    Historical Provider, MD  Cholecalciferol (VITAMIN D PO) Take 1 tablet by mouth 2 (two) times daily.    Historical Provider, MD  co-enzyme Q-10 50 MG capsule Take 50 mg by mouth daily.    Historical Provider, MD  ezetimibe (ZETIA) 10 MG tablet Take 1 tablet (10 mg total) by mouth daily. 06/10/16   Chelle Jeffery, PA-C  GLUCOSAMINE-CHONDROITIN PO Take 1 tablet by mouth 2 (two) times daily.    Historical Provider, MD  LORazepam (ATIVAN) 2 MG tablet TAKE 1/2 TO 1 TABLET BY MOUTH AT BEDTIME AS NEEDED FOR SLEEP 09/04/16   Chelle Jeffery, PA-C  mineral oil liquid Gently place mineral oil soaked cotton in the both ears weekly and leave in for 20 minutes. 02/07/16   Tereasa Coop, PA-C  Multiple Vitamins-Minerals (MULTIVITAMIN PO) Take by mouth daily.    Historical Provider, MD  Omega-3 Fatty Acids (FISH OIL PO) Take by mouth 2 (two) times daily.    Historical Provider, MD    sertraline (ZOLOFT) 25 MG tablet take 1 tablet by mouth once daily 08/18/16   Chelle Jeffery, PA-C     No Known Allergies     Objective:  Physical Exam  Constitutional: She is oriented to person, place, and time. She appears well-developed and well-nourished.  Blood pressure 112/73, pulse 80, temperature 98 F (36.7 C), temperature source Oral, resp. rate 18, height 5\' 2"  (1.575 m), weight 215 lb (97.5 kg), SpO2 96 %.  HENT:  Head: Normocephalic.  Right Ear: External ear normal.  Eyes: Conjunctivae are normal. Pupils are equal, round, and reactive to light.  Neck: Normal range of motion.  Cardiovascular: Normal rate, regular rhythm and normal heart sounds.   Pulses:      Radial pulses are 2+ on the right side, and 2+ on the left side.  Pulmonary/Chest: Effort normal and breath sounds normal.  Neurological: She is alert and oriented to person, place, and time.  Skin: Skin is warm and dry.  Psychiatric: She has a normal mood and affect. Her speech is normal and behavior is normal. Judgment and thought content normal. Cognition and memory are normal.       Assessment & Plan:  1. Pre-operative clearance - CBC with Differential/Platelet - EKG 12-Lead - COMPLETE METABOLIC PANEL WITH GFR

## 2016-09-26 NOTE — Progress Notes (Signed)
Deanna Allen  MRN: [id number] DOB: 12/04/1950  PCP: Harrison Mons, PA-C  Chief Complaint  Patient presents with  . Other    surgical clearance   Subjective:  Pt presents to clinic for surgical clearance for her right knee replacement.  She has not scheduled yet - she has to have her clearance before they will schedule she is hoping in the next month or so.  She exercises 4 times a week with silver sneakers - she does not go on heavy balance days because her knee messed up her balance.  She is excited to be able to do these days at the gym.  She is able to walk up a flight of steps without chest pain or SOB.  Since starting her Zetia in October she has not had her cholesterol checked and she is fasting today.  Review of Systems  Constitutional: Negative for chills and fever.  Respiratory: Negative for cough.   Cardiovascular: Negative for chest pain, palpitations and leg swelling.  Neurological: Negative for dizziness, light-headedness and headaches.    Patient Active Problem List   Diagnosis Date Noted  . 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 Arthritis   . Insomnia, persistent 04/25/2012  . History of anxiety disorder 04/25/2012  . Obesity, Class III, BMI 40-49.9 (morbid obesity) ([place]) 04/25/2012  . Hyperlipidemia 04/25/2012    Current Outpatient Prescriptions on File Prior to Visit  Medication Sig Dispense Refill  . calcium carbonate (OS-CAL) 600 MG TABS Take 600 mg by mouth 2 (two) times daily with a meal.    . Cholecalciferol (VITAMIN D PO) Take 1 tablet by mouth 2 (two) times daily.    Marland Kitchen co-enzyme Q-10 50 MG capsule Take 50 mg by mouth daily.    Marland Kitchen ezetimibe (ZETIA) 10 MG tablet Take 1 tablet (10 mg total) by mouth daily. 90 tablet 3  . GLUCOSAMINE-CHONDROITIN PO Take 1 tablet by mouth 2 (two) times daily.    Marland Kitchen LORazepam (ATIVAN) 2 MG tablet TAKE 1/2 TO 1 TABLET BY MOUTH AT BEDTIME AS NEEDED FOR SLEEP 30 tablet 0  . mineral oil liquid Gently place mineral oil soaked cotton in  the both ears weekly and leave in for 20 minutes. 180 mL 0  . Multiple Vitamins-Minerals (MULTIVITAMIN PO) Take by mouth daily.    . Omega-3 Fatty Acids (FISH OIL PO) Take by mouth 2 (two) times daily.    . sertraline (ZOLOFT) 25 MG tablet take 1 tablet by mouth once daily 90 tablet 1  . aspirin 81 MG tablet Take 81 mg by mouth daily.     No current facility-administered medications on file prior to visit.     No Known Allergies   Social History  Substance Use Topics  . Smoking status: Never Smoker  . Smokeless tobacco: Never Used  . Alcohol use Yes     Comment: 2-3 a month   Family History  Problem Relation Age of Onset  . Colon cancer Father 60  . Leukemia Father   . Heart attack Father   . Hyperlipidemia Father   . Hypertension Father   . Colon cancer Maternal Grandmother     DX  AGE 74'S  . Alcohol abuse Maternal Grandmother   . Muscular dystrophy Mother 51  . Alcohol abuse Maternal Grandfather   . Emphysema Maternal Grandfather   . Asthma Son   . Esophageal cancer Neg Hx   . Stomach cancer Neg Hx   . Rectal cancer Neg Hx   . Diabetes Neg Hx   .  Sudden death Neg Hx       Objective:  BP 112/73   Pulse 80   Temp 98 F (36.7 C) (Oral)   Resp 18   Ht 5\' 2"  (1.575 m)   Wt 215 lb (97.5 kg)   SpO2 96%   BMI 39.32 kg/m   Physical Exam  Constitutional: She is oriented to person, place, and time and well-developed, well-nourished, and in no distress.  HENT:  Head: Normocephalic and atraumatic.  Right Ear: Hearing and external ear normal.  Left Ear: Hearing and external ear normal.  Eyes: Conjunctivae are normal.  Neck: Normal range of motion.  Cardiovascular: Normal rate, regular rhythm and normal heart sounds.   No murmur heard. Pulmonary/Chest: Effort normal and breath sounds normal. She has no wheezes.  Neurological: She is alert and oriented to person, place, and time. Gait normal.  Skin: Skin is warm and dry.  Psychiatric: Mood, memory, affect and  judgment normal.  Vitals reviewed.    ECG - NSR without acute changes - read with Dr Tamala Julian.  Assessment and Plan :  Pre-operative clearance - Plan: EKG 12-Lead, CBC with Differential/Platelet, COMPLETE METABOLIC PANEL WITH GFR  Hyperlipidemia, unspecified hyperlipidemia type - Plan: COMPLETE METABOLIC PANEL WITH GFR, Lipid panel - check labs - pt is on medications  Obesity, Class III, BMI 40-49.9 (morbid obesity) (HCC)  Arthritis of right knee - pt is scheduled to have knee replacement  Wait for lab but I do not feel like patient is at increased cardiac risk associated to upcoming surgery.  Will fax form, labs and EKG to ortho office.  Windell Hummingbird PA-C  [facility] 09/26/2016 10:58 AM

## 2016-09-26 NOTE — Patient Instructions (Addendum)
     IF you received an x-ray today, you will receive an invoice from [facility]. Please contact [facility] at [phone] with questions or concerns regarding your invoice.   IF you received labwork today, you will receive an invoice from [facility]. Please contact [facility] at [phone] with questions or concerns regarding your invoice.   Our billing staff will not be able to assist you with questions regarding bills from these companies.  You will be contacted with the lab results as soon as they are available. The fastest way to get your results is to activate your My Chart account. Instructions are located on the last page of this paperwork. If you have not heard from us regarding the results in 2 weeks, please contact this office.     

## 2016-09-27 LAB — LIPID PANEL
CHOL/HDL RATIO: 5.2 ratio — AB (ref 0.0–4.4)
Cholesterol, Total: 238 mg/dL — ABNORMAL HIGH (ref 100–199)
HDL: 46 mg/dL (ref >39)
LDL CALC: 165 mg/dL — AB (ref 0–99)
Triglycerides: 137 mg/dL (ref 0–149)
VLDL Cholesterol Cal: 27 mg/dL (ref 5–40)

## 2016-09-27 LAB — CBC WITH DIFFERENTIAL/PLATELET
BASOS: 1 %
Basophils Absolute: 0.1 10*3/uL (ref 0.0–0.2)
EOS (ABSOLUTE): 0.2 10*3/uL (ref 0.0–0.4)
EOS: 4 %
HEMATOCRIT: 43.9 % (ref 34.0–46.6)
Hemoglobin: 15 g/dL (ref 11.1–15.9)
IMMATURE GRANULOCYTES: 0 %
Immature Grans (Abs): 0 10*3/uL (ref 0.0–0.1)
LYMPHS ABS: 2 10*3/uL (ref 0.7–3.1)
Lymphs: 35 %
MCH: 30.9 pg (ref 26.6–33.0)
MCHC: 34.2 g/dL (ref 31.5–35.7)
MCV: 91 fL (ref 79–97)
MONOS ABS: 0.5 10*3/uL (ref 0.1–0.9)
Monocytes: 8 %
NEUTROS PCT: 52 %
Neutrophils Absolute: 3.1 10*3/uL (ref 1.4–7.0)
Platelets: 204 10*3/uL (ref 150–379)
RBC: 4.85 x10E6/uL (ref 3.77–5.28)
RDW: 14.3 % (ref 12.3–15.4)
WBC: 5.9 10*3/uL (ref 3.4–10.8)

## 2016-10-05 ENCOUNTER — Other Ambulatory Visit: Payer: Self-pay | Admitting: Orthopedic Surgery

## 2016-10-27 ENCOUNTER — Encounter (HOSPITAL_COMMUNITY): Payer: Self-pay

## 2016-10-27 ENCOUNTER — Encounter (HOSPITAL_COMMUNITY)
Admission: RE | Admit: 2016-10-27 | Discharge: 2016-10-27 | Disposition: A | Discharge status: Home / Self Care | Payer: Medicare HMO | Source: Ambulatory Visit | Attending: Orthopedic Surgery | Admitting: Orthopedic Surgery

## 2016-10-27 DIAGNOSIS — Z01812 Encounter for preprocedural laboratory examination: Secondary | ICD-10-CM | POA: Diagnosis not present

## 2016-10-27 DIAGNOSIS — M1711 Unilateral primary osteoarthritis, right knee: Secondary | ICD-10-CM | POA: Diagnosis not present

## 2016-10-27 HISTORY — DX: Family history of other specified conditions: Z84.89

## 2016-10-27 HISTORY — DX: Inflammatory liver disease, unspecified: K75.9

## 2016-10-27 LAB — CBC WITH DIFFERENTIAL/PLATELET
BASOS PCT: 0 %
Basophils Absolute: 0 10*3/uL (ref 0.0–0.1)
EOS ABS: 0.3 10*3/uL (ref 0.0–0.7)
EOS PCT: 4 %
HCT: 40.2 % (ref 36.0–46.0)
Hemoglobin: 13.4 g/dL (ref 12.0–15.0)
LYMPHS ABS: 2.3 10*3/uL (ref 0.7–4.0)
Lymphocytes Relative: 35 %
MCH: 30 pg (ref 26.0–34.0)
MCHC: 33.3 g/dL (ref 30.0–36.0)
MCV: 90.1 fL (ref 78.0–100.0)
Monocytes Absolute: 0.5 10*3/uL (ref 0.1–1.0)
Monocytes Relative: 8 %
Neutro Abs: 3.6 10*3/uL (ref 1.7–7.7)
Neutrophils Relative %: 53 %
PLATELETS: 182 10*3/uL (ref 150–400)
RBC: 4.46 MIL/uL (ref 3.87–5.11)
RDW: 13.2 % (ref 11.5–15.5)
WBC: 6.7 10*3/uL (ref 4.0–10.5)

## 2016-10-27 LAB — COMPREHENSIVE METABOLIC PANEL
ALT: 22 U/L (ref 14–54)
ANION GAP: 11 (ref 5–15)
AST: 29 U/L (ref 15–41)
Albumin: 3.9 g/dL (ref 3.5–5.0)
Alkaline Phosphatase: 99 U/L (ref 38–126)
BUN: 19 mg/dL (ref 6–20)
CO2: 22 mmol/L (ref 22–32)
CREATININE: 0.95 mg/dL (ref 0.44–1.00)
Calcium: 9.4 mg/dL (ref 8.9–10.3)
Chloride: 108 mmol/L (ref 101–111)
GFR calc non Af Amer: >60 mL/min (ref >60)
Glucose, Bld: 88 mg/dL (ref 65–99)
Potassium: 4 mmol/L (ref 3.5–5.1)
Sodium: 141 mmol/L (ref 135–145)
Total Bilirubin: 0.5 mg/dL (ref 0.3–1.2)
Total Protein: 6.7 g/dL (ref 6.5–8.1)

## 2016-10-27 LAB — SURGICAL PCR SCREEN
MRSA, PCR: NEGATIVE
STAPHYLOCOCCUS AUREUS: NEGATIVE

## 2016-10-27 NOTE — Pre-Procedure Instructions (Addendum)
Deanna Allen  10/27/2016      [id number] - [place], [place] [address] Phone: [phone] Fax: [phone]  [place], [place] [address] [place] [address] Phone: [phone] Fax: [phone]  [facility] [address] [place], [place] [address] Phone: [phone] Fax: [place] [address] [place] [employer] [place] [address] Phone: [phone] Fax: [phone]  [facility] [place], [place] - [address] AT [place] [address] Phone: [phone] Fax: [phone]  [facility] [address] [place], [place] [address] Phone: [phone] Fax: [phone]    Your procedure is scheduled on 11/06/16.  Report to [facility] Admitting at 615 A.M.  Call this number if you have problems the morning of surgery:  [phone]   Remember:  Do not eat food or drink liquids after midnight.  Take these medicines the morning of surgery with A SIP OF WATER        Lorazepam if needed, sertraline(zoloft)  STOP all herbel meds, nsaids (aleve,naproxen,advil,ibuprofen)7 days prior to surgery starting 10/30/16 including all vitamins/supplements(coq10 ,glucosamine,krill oil) aspirin   Do not wear jewelry, make-up or nail polish.  Do not wear lotions, powders, or perfumes, or deoderant.  Do not shave 48 hours prior to surgery.  Men may shave face and neck.  Do not bring valuables to the hospital.  [facility] is not responsible for any belongings or valuables.  Contacts, dentures or bridgework may not be worn into surgery.  Leave your suitcase in the car.   After surgery it may be brought to your room.  For patients admitted to the hospital, discharge time will be determined by your treatment team.  Patients discharged the day of surgery will not be allowed to drive home.   Special instructions:   Special Instructions: [facility] - Preparing for Surgery  Before surgery, you can play an important role.  Because skin is not sterile, your skin needs to be as free of germs as possible.  You can reduce the number of germs on you skin by washing with CHG (chlorahexidine gluconate) soap before surgery.  CHG is an antiseptic cleaner which kills germs and bonds with the skin to continue killing germs even after washing.  Please DO NOT use if you have an allergy to CHG or antibacterial soaps.  If your skin becomes reddened/irritated stop using the CHG and inform your nurse when you arrive at Short Stay.  Do not shave (including legs and underarms) for at least 48 hours prior to the first CHG shower.  You may shave your face.  Please follow these instructions carefully:   1.  Shower with CHG Soap the night before surgery and the morning of Surgery.  2.  If you choose to wash your hair, wash your hair first as usual with your normal shampoo.  3.  After you shampoo, rinse your hair and body thoroughly to remove the Shampoo.  4.  Use CHG as you would any other liquid soap.  You can apply chg directly  to the skin and wash gently with scrungie or a clean washcloth.  5.  Apply  the CHG Soap to your body ONLY FROM THE NECK DOWN.  Do not use on open wounds or open sores.  Avoid contact with your eyes ears, mouth and genitals (private parts).  Wash genitals (private parts)       with your normal soap.  6.  Wash thoroughly, paying special attention to the area where your surgery will be performed.  7.  Thoroughly rinse your body with warm water from the neck down.  8.  DO NOT shower/wash with your normal soap after using and rinsing off the CHG Soap.  9.  Pat  yourself dry with a clean towel.            10.  Wear clean pajamas.            11.  Place clean sheets on your bed the night of your first shower and do not sleep with pets.  Day of Surgery  Do not apply any lotions/deodorants the morning of surgery.  Please wear clean clothes to the hospital/surgery center.  Please read over the  fact sheets that you were given.

## 2016-11-03 MED ORDER — DEXAMETHASONE SODIUM PHOSPHATE 10 MG/ML IJ SOLN
8.0000 mg | INTRAMUSCULAR | Status: AC
  Administered 2016-11-06: 8 mg via INTRAVENOUS
  Filled 2016-11-03: qty 1

## 2016-11-03 MED ORDER — CEFAZOLIN SODIUM-DEXTROSE 2-4 GM/100ML-% IV SOLN
2.0000 g | INTRAVENOUS | Status: AC
  Administered 2016-11-06: 2 g via INTRAVENOUS
  Filled 2016-11-03: qty 100

## 2016-11-03 MED ORDER — BUPIVACAINE LIPOSOME 1.3 % IJ SUSP
20.0000 mL | INTRAMUSCULAR | Status: AC
  Administered 2016-11-06: 20 mL
  Filled 2016-11-03: qty 20

## 2016-11-03 MED ORDER — TRANEXAMIC ACID 1000 MG/10ML IV SOLN
1000.0000 mg | INTRAVENOUS | Status: AC
  Administered 2016-11-06: 1000 mg via INTRAVENOUS
  Filled 2016-11-03: qty 10

## 2016-11-03 MED ORDER — ACETAMINOPHEN 500 MG PO TABS
1000.0000 mg | ORAL_TABLET | Freq: Once | ORAL | Status: AC
  Administered 2016-11-06: 1000 mg via ORAL
  Filled 2016-11-03: qty 2

## 2016-11-03 MED ORDER — GABAPENTIN 300 MG PO CAPS
300.0000 mg | ORAL_CAPSULE | ORAL | Status: AC
  Administered 2016-11-06: 300 mg via ORAL
  Filled 2016-11-03: qty 1

## 2016-11-05 NOTE — H&P (Signed)
Deanna Allen MRN:  [id number] DOB/SEX:  1950/12/23/female  CHIEF COMPLAINT:  Painful right Knee  HISTORY: Patient is a 66 y.o. female presented with a history of pain in the right knee. Onset of symptoms was gradual starting a few years ago with gradually worsening course since that time. Patient has been treated conservatively with over-the-counter NSAIDs and activity modification. Patient currently rates pain in the knee at 10 out of 10 with activity. There is pain at night.  PAST MEDICAL HISTORY: Patient Active Problem List   Diagnosis Date Noted  . 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 Arthritis   . Insomnia, persistent 04/25/2012  . History of anxiety disorder 04/25/2012  . Obesity, Class III, BMI 40-49.9 (morbid obesity) ([place]) 04/25/2012  . Hyperlipidemia 04/25/2012   Past Medical History:  Diagnosis Date  . Anxiety   . Arthritis    RIGHT knee, bone on bone  . Cancer (HCC)    basal cell CA-face  . Family history of adverse reaction to anesthesia    sister n&v  . Hepatitis    as a baby  . Hyperlipidemia   . Left ankle injury 12/11/2012   Past Surgical History:  Procedure Laterality Date  . APPENDECTOMY    . BREAST LUMPECTOMY WITH RADIOACTIVE SEED LOCALIZATION Right 01/11/2016   Procedure: RIGHT BREAST LUMPECTOMY WITH RADIOACTIVE SEED LOCALIZATION;  Surgeon: Coralie Keens, MD;  Location: [facility];  Service: General;  Laterality: Right;  RIGHT BREAST LUMPECTOMY WITH RADIOACTIVE SEED LOCALIZATION  . COLONOSCOPY    . GANGLION CYST EXCISION Right    wrist  . KNEE SURGERY  2001   right arthroscopy     MEDICATIONS:   No prescriptions prior to admission.    ALLERGIES:   Allergies  Allergen Reactions  . No Known Allergies     REVIEW OF SYSTEMS:  A comprehensive review of systems was negative except for: Musculoskeletal: positive for arthralgias and bone pain   FAMILY HISTORY:   Family History  Problem Relation Age of Onset  . Colon cancer Father  66  . Leukemia Father   . Heart attack Father   . Hyperlipidemia Father   . Hypertension Father   . Colon cancer Maternal Grandmother     DX  AGE 85'S  . Alcohol abuse Maternal Grandmother   . Muscular dystrophy Mother 48  . Alcohol abuse Maternal Grandfather   . Emphysema Maternal Grandfather   . Asthma Son   . Esophageal cancer Neg Hx   . Stomach cancer Neg Hx   . Rectal cancer Neg Hx   . Diabetes Neg Hx   . Sudden death Neg Hx     SOCIAL HISTORY:   Social History  Substance Use Topics  . Smoking status: Never Smoker  . Smokeless tobacco: Never Used  . Alcohol use Yes     Comment: 2-3 a month     EXAMINATION:  Vital signs in last 24 hours:    There were no vitals taken for this visit.  General Appearance:    Alert, cooperative, no distress, appears stated age  Head:    Normocephalic, without obvious abnormality, atraumatic  Eyes:    PERRL, conjunctiva/corneas clear, EOM's intact, fundi    benign, both eyes  Ears:    Normal TM's and external ear canals, both ears  Nose:   Nares normal, septum midline, mucosa normal, no drainage    or sinus tenderness  Throat:   Lips, mucosa, and tongue normal; teeth and gums normal  Neck:  Supple, symmetrical, trachea midline, no adenopathy;    thyroid:  no enlargement/tenderness/nodules; no carotid   bruit or JVD  Back:     Symmetric, no curvature, ROM normal, no CVA tenderness  Lungs:     Clear to auscultation bilaterally, respirations unlabored  Chest Wall:    No tenderness or deformity   Heart:    Regular rate and rhythm, S1 and S2 normal, no murmur, rub   or gallop  Breast Exam:    No tenderness, masses, or nipple abnormality  Abdomen:     Soft, non-tender, bowel sounds active all four quadrants,    no masses, no organomegaly  Genitalia:    Normal female without lesion, discharge or tenderness  Rectal:    Normal tone, no masses or tenderness;   guaiac negative stool  Extremities:   Extremities normal, atraumatic, no  cyanosis or edema  Pulses:   2+ and symmetric all extremities  Skin:   Skin color, texture, turgor normal, no rashes or lesions  Lymph nodes:   Cervical, supraclavicular, and axillary nodes normal  Neurologic:   CNII-XII intact, normal strength, sensation and reflexes    throughout    Musculoskeletal:  ROM 0-120, Ligaments intact,  Imaging Review Plain radiographs demonstrate severe degenerative joint disease of the right knee. The overall alignment is neutral. The bone quality appears to be good for age and reported activity level.  Assessment/Plan: Primary osteoarthritis, right knee   The patient history, physical examination and imaging studies are consistent with advanced degenerative joint disease of the right knee. The patient has failed conservative treatment.  The clearance notes were reviewed.  After discussion with the patient it was felt that Total Knee Replacement was indicated. The procedure,  risks, and benefits of total knee arthroplasty were presented and reviewed. The risks including but not limited to aseptic loosening, infection, blood clots, vascular injury, stiffness, patella tracking problems complications among others were discussed. The patient acknowledged the explanation, agreed to proceed with the plan.  Donia Ast 11/05/2016, 8:45 PM

## 2016-11-06 ENCOUNTER — Observation Stay (HOSPITAL_COMMUNITY)
Admission: RE | Admit: 2016-11-06 | Discharge: 2016-11-07 | Disposition: A | Discharge status: Home / Self Care | Payer: Medicare HMO | Source: Ambulatory Visit | Attending: Orthopedic Surgery | Admitting: Orthopedic Surgery

## 2016-11-06 ENCOUNTER — Encounter (HOSPITAL_COMMUNITY): Payer: Self-pay | Admitting: Urology

## 2016-11-06 ENCOUNTER — Ambulatory Visit (HOSPITAL_COMMUNITY): Payer: Medicare HMO | Admitting: Certified Registered"

## 2016-11-06 ENCOUNTER — Encounter (HOSPITAL_COMMUNITY)
Admission: RE | Disposition: A | Discharge status: Home / Self Care | Payer: Self-pay | Source: Ambulatory Visit | Attending: Orthopedic Surgery

## 2016-11-06 DIAGNOSIS — Z811 Family history of alcohol abuse and dependence: Secondary | ICD-10-CM | POA: Insufficient documentation

## 2016-11-06 DIAGNOSIS — Z85828 Personal history of other malignant neoplasm of skin: Secondary | ICD-10-CM | POA: Diagnosis not present

## 2016-11-06 DIAGNOSIS — F419 Anxiety disorder, unspecified: Secondary | ICD-10-CM | POA: Diagnosis not present

## 2016-11-06 DIAGNOSIS — Z23 Encounter for immunization: Secondary | ICD-10-CM | POA: Diagnosis not present

## 2016-11-06 DIAGNOSIS — G8918 Other acute postprocedural pain: Secondary | ICD-10-CM | POA: Diagnosis not present

## 2016-11-06 DIAGNOSIS — Z8249 Family history of ischemic heart disease and other diseases of the circulatory system: Secondary | ICD-10-CM | POA: Insufficient documentation

## 2016-11-06 DIAGNOSIS — E785 Hyperlipidemia, unspecified: Secondary | ICD-10-CM | POA: Insufficient documentation

## 2016-11-06 DIAGNOSIS — Z77011 Contact with and (suspected) exposure to lead: Secondary | ICD-10-CM | POA: Insufficient documentation

## 2016-11-06 DIAGNOSIS — Z806 Family history of leukemia: Secondary | ICD-10-CM | POA: Insufficient documentation

## 2016-11-06 DIAGNOSIS — Z6841 Body Mass Index (BMI) 40.0 and over, adult: Secondary | ICD-10-CM | POA: Diagnosis not present

## 2016-11-06 DIAGNOSIS — Z96651 Presence of right artificial knee joint: Secondary | ICD-10-CM

## 2016-11-06 DIAGNOSIS — R69 Illness, unspecified: Secondary | ICD-10-CM | POA: Diagnosis not present

## 2016-11-06 DIAGNOSIS — G4709 Other insomnia: Secondary | ICD-10-CM | POA: Diagnosis not present

## 2016-11-06 DIAGNOSIS — M1711 Unilateral primary osteoarthritis, right knee: Secondary | ICD-10-CM | POA: Diagnosis not present

## 2016-11-06 DIAGNOSIS — Z825 Family history of asthma and other chronic lower respiratory diseases: Secondary | ICD-10-CM | POA: Insufficient documentation

## 2016-11-06 DIAGNOSIS — G4700 Insomnia, unspecified: Secondary | ICD-10-CM | POA: Diagnosis not present

## 2016-11-06 DIAGNOSIS — Z800 Family history of malignant neoplasm of digestive organs: Secondary | ICD-10-CM | POA: Insufficient documentation

## 2016-11-06 HISTORY — PX: TOTAL KNEE ARTHROPLASTY: SHX125

## 2016-11-06 SURGERY — ARTHROPLASTY, KNEE, TOTAL
Anesthesia: Spinal | Site: Knee | Laterality: Right

## 2016-11-06 MED ORDER — EZETIMIBE 10 MG PO TABS
10.0000 mg | ORAL_TABLET | Freq: Every day | ORAL | Status: DC
  Administered 2016-11-06 – 2016-11-07 (×2): 10 mg via ORAL
  Filled 2016-11-06 (×2): qty 1

## 2016-11-06 MED ORDER — BUPIVACAINE-EPINEPHRINE (PF) 0.5% -1:200000 IJ SOLN
INTRAMUSCULAR | Status: DC | PRN
  Administered 2016-11-06: 30 mL via PERINEURAL

## 2016-11-06 MED ORDER — ROCURONIUM BROMIDE 50 MG/5ML IV SOSY
PREFILLED_SYRINGE | INTRAVENOUS | Status: AC
  Filled 2016-11-06: qty 5

## 2016-11-06 MED ORDER — DOCUSATE SODIUM 100 MG PO CAPS
100.0000 mg | ORAL_CAPSULE | Freq: Two times a day (BID) | ORAL | Status: DC
  Administered 2016-11-06 – 2016-11-07 (×3): 100 mg via ORAL
  Filled 2016-11-06 (×3): qty 1

## 2016-11-06 MED ORDER — ZOLPIDEM TARTRATE 5 MG PO TABS: 5.0000 mg | ORAL_TABLET | Freq: Every evening | ORAL | Status: DC | PRN

## 2016-11-06 MED ORDER — LACTATED RINGERS IV SOLN
INTRAVENOUS | Status: DC
  Administered 2016-11-06 (×2): via INTRAVENOUS

## 2016-11-06 MED ORDER — MIDAZOLAM HCL 2 MG/2ML IJ SOLN
INTRAMUSCULAR | Status: AC
  Administered 2016-11-06: 1 mg via INTRAVENOUS
  Filled 2016-11-06: qty 2

## 2016-11-06 MED ORDER — EPINEPHRINE PF 1 MG/ML IJ SOLN
INTRAMUSCULAR | Status: AC
  Filled 2016-11-06: qty 1

## 2016-11-06 MED ORDER — MENTHOL 3 MG MT LOZG
1.0000 | LOZENGE | OROMUCOSAL | Status: DC | PRN
Start: 2016-11-06 — End: 2016-11-07

## 2016-11-06 MED ORDER — METHOCARBAMOL 500 MG PO TABS
500.0000 mg | ORAL_TABLET | Freq: Four times a day (QID) | ORAL | Status: DC | PRN
  Administered 2016-11-06: 500 mg via ORAL
  Filled 2016-11-06: qty 1

## 2016-11-06 MED ORDER — MIDAZOLAM HCL 2 MG/2ML IJ SOLN
1.0000 mg | Freq: Once | INTRAMUSCULAR | Status: AC
  Administered 2016-11-06: 1 mg via INTRAVENOUS

## 2016-11-06 MED ORDER — FENTANYL CITRATE (PF) 100 MCG/2ML IJ SOLN
50.0000 ug | Freq: Once | INTRAMUSCULAR | Status: AC
  Administered 2016-11-06: 50 ug via INTRAVENOUS

## 2016-11-06 MED ORDER — DEXAMETHASONE SODIUM PHOSPHATE 10 MG/ML IJ SOLN
10.0000 mg | Freq: Once | INTRAMUSCULAR | Status: AC
  Administered 2016-11-07: 10 mg via INTRAVENOUS
  Filled 2016-11-06: qty 1

## 2016-11-06 MED ORDER — METHOCARBAMOL 1000 MG/10ML IJ SOLN
500.0000 mg | Freq: Four times a day (QID) | INTRAVENOUS | Status: DC | PRN
  Filled 2016-11-06: qty 5

## 2016-11-06 MED ORDER — CHLORHEXIDINE GLUCONATE 4 % EX LIQD: 60.0000 mL | Freq: Once | CUTANEOUS | Status: DC

## 2016-11-06 MED ORDER — GABAPENTIN 300 MG PO CAPS
300.0000 mg | ORAL_CAPSULE | Freq: Three times a day (TID) | ORAL | Status: DC
  Administered 2016-11-06 – 2016-11-07 (×4): 300 mg via ORAL
  Filled 2016-11-06 (×4): qty 1

## 2016-11-06 MED ORDER — TRANEXAMIC ACID 1000 MG/10ML IV SOLN
1000.0000 mg | Freq: Once | INTRAVENOUS | Status: AC
  Administered 2016-11-06: 1000 mg via INTRAVENOUS
  Filled 2016-11-06: qty 10

## 2016-11-06 MED ORDER — LORAZEPAM 1 MG PO TABS: 1.0000 mg | ORAL_TABLET | Freq: Every evening | ORAL | Status: DC | PRN

## 2016-11-06 MED ORDER — LIDOCAINE 2% (20 MG/ML) 5 ML SYRINGE
INTRAMUSCULAR | Status: DC | PRN
Start: 2016-11-06 — End: 2016-11-06
  Administered 2016-11-06: 40 mg via INTRAVENOUS

## 2016-11-06 MED ORDER — ASPIRIN EC 325 MG PO TBEC
325.0000 mg | DELAYED_RELEASE_TABLET | Freq: Two times a day (BID) | ORAL | Status: DC
  Administered 2016-11-06 – 2016-11-07 (×3): 325 mg via ORAL
  Filled 2016-11-06 (×3): qty 1

## 2016-11-06 MED ORDER — BISACODYL 5 MG PO TBEC: 5.0000 mg | DELAYED_RELEASE_TABLET | Freq: Every day | ORAL | Status: DC | PRN

## 2016-11-06 MED ORDER — LIDOCAINE 2% (20 MG/ML) 5 ML SYRINGE
INTRAMUSCULAR | Status: AC
  Filled 2016-11-06: qty 5

## 2016-11-06 MED ORDER — PROPOFOL 500 MG/50ML IV EMUL
INTRAVENOUS | Status: DC | PRN
  Administered 2016-11-06: 100 ug/kg/min via INTRAVENOUS

## 2016-11-06 MED ORDER — ALUM & MAG HYDROXIDE-SIMETH 200-200-20 MG/5ML PO SUSP: 30.0000 mL | ORAL | Status: DC | PRN

## 2016-11-06 MED ORDER — ACETAMINOPHEN 325 MG PO TABS: 650.0000 mg | ORAL_TABLET | Freq: Four times a day (QID) | ORAL | Status: DC | PRN

## 2016-11-06 MED ORDER — HYDROMORPHONE HCL 1 MG/ML IJ SOLN: 0.2500 mg | INTRAMUSCULAR | Status: DC | PRN

## 2016-11-06 MED ORDER — SERTRALINE HCL 25 MG PO TABS
25.0000 mg | ORAL_TABLET | Freq: Every day | ORAL | Status: DC
  Administered 2016-11-07: 25 mg via ORAL
  Filled 2016-11-06: qty 1

## 2016-11-06 MED ORDER — EPINEPHRINE PF 1 MG/ML IJ SOLN
INTRAMUSCULAR | Status: DC | PRN
  Administered 2016-11-06: .15 mL

## 2016-11-06 MED ORDER — METOCLOPRAMIDE HCL 5 MG PO TABS: 5.0000 mg | ORAL_TABLET | Freq: Three times a day (TID) | ORAL | Status: DC | PRN

## 2016-11-06 MED ORDER — MIDAZOLAM HCL 2 MG/2ML IJ SOLN
INTRAMUSCULAR | Status: AC
  Filled 2016-11-06: qty 2

## 2016-11-06 MED ORDER — ACETAMINOPHEN 650 MG RE SUPP: 650.0000 mg | Freq: Four times a day (QID) | RECTAL | Status: DC | PRN

## 2016-11-06 MED ORDER — PROPOFOL 10 MG/ML IV BOLUS
INTRAVENOUS | Status: DC | PRN
  Administered 2016-11-06: 10 mg via INTRAVENOUS
  Administered 2016-11-06: 20 mg via INTRAVENOUS

## 2016-11-06 MED ORDER — PROMETHAZINE HCL 25 MG/ML IJ SOLN: 6.2500 mg | INTRAMUSCULAR | Status: DC | PRN

## 2016-11-06 MED ORDER — PNEUMOCOCCAL VAC POLYVALENT 25 MCG/0.5ML IJ INJ
0.5000 mL | INJECTION | INTRAMUSCULAR | Status: AC
  Administered 2016-11-07: 0.5 mL via INTRAMUSCULAR
  Filled 2016-11-06: qty 0.5

## 2016-11-06 MED ORDER — ONDANSETRON HCL 4 MG PO TABS: 4.0000 mg | ORAL_TABLET | Freq: Four times a day (QID) | ORAL | Status: DC | PRN

## 2016-11-06 MED ORDER — SODIUM CHLORIDE 0.9 % IJ SOLN
INTRAMUSCULAR | Status: DC | PRN
  Administered 2016-11-06: 20 mL

## 2016-11-06 MED ORDER — PROPOFOL 10 MG/ML IV BOLUS
INTRAVENOUS | Status: AC
  Filled 2016-11-06: qty 20

## 2016-11-06 MED ORDER — EPHEDRINE 5 MG/ML INJ
INTRAVENOUS | Status: AC
  Filled 2016-11-06: qty 10

## 2016-11-06 MED ORDER — HYDROCODONE-ACETAMINOPHEN 7.5-325 MG PO TABS
1.0000 | ORAL_TABLET | Freq: Four times a day (QID) | ORAL | Status: DC
  Administered 2016-11-06 – 2016-11-07 (×5): 1 via ORAL
  Filled 2016-11-06 (×5): qty 1

## 2016-11-06 MED ORDER — SODIUM CHLORIDE 0.9 % IR SOLN
Status: DC | PRN
  Administered 2016-11-06: 3000 mL

## 2016-11-06 MED ORDER — FLEET ENEMA 7-19 GM/118ML RE ENEM: 1.0000 | ENEMA | Freq: Once | RECTAL | Status: DC | PRN

## 2016-11-06 MED ORDER — OXYCODONE HCL 5 MG PO TABS
5.0000 mg | ORAL_TABLET | ORAL | Status: DC | PRN
  Administered 2016-11-06 – 2016-11-07 (×2): 10 mg via ORAL
  Filled 2016-11-06 (×2): qty 2

## 2016-11-06 MED ORDER — CELECOXIB 200 MG PO CAPS
200.0000 mg | ORAL_CAPSULE | Freq: Two times a day (BID) | ORAL | Status: DC
  Administered 2016-11-06 – 2016-11-07 (×3): 200 mg via ORAL
  Filled 2016-11-06 (×3): qty 1

## 2016-11-06 MED ORDER — BUPIVACAINE HCL (PF) 0.25 % IJ SOLN
INTRAMUSCULAR | Status: AC
  Filled 2016-11-06: qty 30

## 2016-11-06 MED ORDER — BUPIVACAINE HCL (PF) 0.5 % IJ SOLN
INTRAMUSCULAR | Status: AC
  Filled 2016-11-06: qty 30

## 2016-11-06 MED ORDER — FENTANYL CITRATE (PF) 250 MCG/5ML IJ SOLN
INTRAMUSCULAR | Status: AC
  Filled 2016-11-06: qty 5

## 2016-11-06 MED ORDER — HYDROMORPHONE HCL 1 MG/ML IJ SOLN: 1.0000 mg | INTRAMUSCULAR | Status: DC | PRN

## 2016-11-06 MED ORDER — PHENOL 1.4 % MT LIQD
1.0000 | OROMUCOSAL | Status: DC | PRN
Start: 2016-11-06 — End: 2016-11-07

## 2016-11-06 MED ORDER — ONDANSETRON HCL 4 MG/2ML IJ SOLN
4.0000 mg | Freq: Four times a day (QID) | INTRAMUSCULAR | Status: DC | PRN
Start: 2016-11-06 — End: 2016-11-07

## 2016-11-06 MED ORDER — EPHEDRINE SULFATE-NACL 50-0.9 MG/10ML-% IV SOSY
PREFILLED_SYRINGE | INTRAVENOUS | Status: DC | PRN
  Administered 2016-11-06 (×3): 10 mg via INTRAVENOUS
  Administered 2016-11-06: 5 mg via INTRAVENOUS
  Administered 2016-11-06: 10 mg via INTRAVENOUS

## 2016-11-06 MED ORDER — METOCLOPRAMIDE HCL 5 MG/ML IJ SOLN: 5.0000 mg | Freq: Three times a day (TID) | INTRAMUSCULAR | Status: DC | PRN

## 2016-11-06 MED ORDER — SENNOSIDES-DOCUSATE SODIUM 8.6-50 MG PO TABS: 1.0000 | ORAL_TABLET | Freq: Every evening | ORAL | Status: DC | PRN

## 2016-11-06 MED ORDER — DIPHENHYDRAMINE HCL 12.5 MG/5ML PO ELIX: 12.5000 mg | ORAL_SOLUTION | ORAL | Status: DC | PRN

## 2016-11-06 MED ORDER — BUPIVACAINE HCL 0.25 % IJ SOLN
INTRAMUSCULAR | Status: DC | PRN
  Administered 2016-11-06: 30 mL

## 2016-11-06 MED ORDER — 0.9 % SODIUM CHLORIDE (POUR BTL) OPTIME: TOPICAL | Status: DC | PRN

## 2016-11-06 MED ORDER — FENTANYL CITRATE (PF) 100 MCG/2ML IJ SOLN
INTRAMUSCULAR | Status: AC
  Administered 2016-11-06: 50 ug via INTRAVENOUS
  Filled 2016-11-06: qty 2

## 2016-11-06 SURGICAL SUPPLY — 63 items
BANDAGE ACE 6X5 VEL STRL LF (GAUZE/BANDAGES/DRESSINGS) ×2 IMPLANT
BANDAGE ESMARK 6X9 LF (GAUZE/BANDAGES/DRESSINGS) ×1 IMPLANT
BLADE SAGITTAL 13X1.27X60 (BLADE) ×2 IMPLANT
BLADE SAW SGTL 83.5X18.5 (BLADE) ×2 IMPLANT
BLADE SURG 10 STRL SS (BLADE) ×2 IMPLANT
BNDG ESMARK 6X9 LF (GAUZE/BANDAGES/DRESSINGS) ×2
BOWL SMART MIX CTS (DISPOSABLE) ×2 IMPLANT
CAPT KNEE TOTAL 3 ×2 IMPLANT
CEMENT BONE SIMPLEX SPEEDSET (Cement) ×4 IMPLANT
CLSR STERI-STRIP ANTIMIC 1/2X4 (GAUZE/BANDAGES/DRESSINGS) ×2 IMPLANT
COVER SURGICAL LIGHT HANDLE (MISCELLANEOUS) ×2 IMPLANT
CUFF TOURNIQUET SINGLE 34IN LL (TOURNIQUET CUFF) ×2 IMPLANT
DECANTER SPIKE VIAL GLASS SM (MISCELLANEOUS) ×2 IMPLANT
DRAPE EXTREMITY T 121X128X90 (DRAPE) ×2 IMPLANT
DRAPE HALF SHEET 40X57 (DRAPES) ×2 IMPLANT
DRAPE INCISE IOBAN 66X45 STRL (DRAPES) ×4 IMPLANT
DRAPE U-SHAPE 47X51 STRL (DRAPES) ×2 IMPLANT
DRSG AQUACEL AG ADV 3.5X10 (GAUZE/BANDAGES/DRESSINGS) ×2 IMPLANT
DURAPREP 26ML APPLICATOR (WOUND CARE) ×4 IMPLANT
ELECT REM PT RETURN 9FT ADLT (ELECTROSURGICAL) ×2
ELECTRODE REM PT RTRN 9FT ADLT (ELECTROSURGICAL) ×1 IMPLANT
FILTER STRAW FLUID ASPIR (MISCELLANEOUS) IMPLANT
GLOVE BIOGEL M 7.0 STRL (GLOVE) ×2 IMPLANT
GLOVE BIOGEL PI IND STRL 7.5 (GLOVE) ×1 IMPLANT
GLOVE BIOGEL PI IND STRL 8.5 (GLOVE) ×1 IMPLANT
GLOVE BIOGEL PI INDICATOR 7.5 (GLOVE) ×1
GLOVE BIOGEL PI INDICATOR 8.5 (GLOVE) ×1
GLOVE SURG ORTHO 8.0 STRL STRW (GLOVE) ×4 IMPLANT
GOWN STRL REUS W/ TWL LRG LVL3 (GOWN DISPOSABLE) ×2 IMPLANT
GOWN STRL REUS W/ TWL XL LVL3 (GOWN DISPOSABLE) ×1 IMPLANT
GOWN STRL REUS W/TWL 2XL LVL3 (GOWN DISPOSABLE) IMPLANT
GOWN STRL REUS W/TWL LRG LVL3 (GOWN DISPOSABLE) ×2
GOWN STRL REUS W/TWL XL LVL3 (GOWN DISPOSABLE) ×1
HANDPIECE INTERPULSE COAX TIP (DISPOSABLE) ×1
HOOD PEEL AWAY FACE SHEILD DIS (HOOD) ×6 IMPLANT
KIT BASIN OR (CUSTOM PROCEDURE TRAY) ×2 IMPLANT
KIT ROOM TURNOVER OR (KITS) ×2 IMPLANT
KNEE CAPITATED TOTAL 3 ×1 IMPLANT
MANIFOLD NEPTUNE II (INSTRUMENTS) ×2 IMPLANT
NEEDLE 18GX1X1/2 (RX/OR ONLY) (NEEDLE) IMPLANT
NEEDLE 22X1 1/2 (OR ONLY) (NEEDLE) ×4 IMPLANT
NEEDLE FILTER BLUNT 18X 1/2SAF (NEEDLE) ×1
NEEDLE FILTER BLUNT 18X1 1/2 (NEEDLE) ×1 IMPLANT
NS IRRIG 1000ML POUR BTL (IV SOLUTION) ×2 IMPLANT
PACK TOTAL JOINT (CUSTOM PROCEDURE TRAY) ×2 IMPLANT
PAD ARMBOARD 7.5X6 YLW CONV (MISCELLANEOUS) ×4 IMPLANT
SET HNDPC FAN SPRY TIP SCT (DISPOSABLE) ×1 IMPLANT
STRIP CLOSURE SKIN 1/2X4 (GAUZE/BANDAGES/DRESSINGS) ×2 IMPLANT
SUCTION FRAZIER HANDLE 10FR (MISCELLANEOUS) ×1
SUCTION TUBE FRAZIER 10FR DISP (MISCELLANEOUS) ×1 IMPLANT
SUT MNCRL AB 3-0 PS2 18 (SUTURE) ×2 IMPLANT
SUT VIC AB 0 CTB1 27 (SUTURE) ×4 IMPLANT
SUT VIC AB 1 CT1 27 (SUTURE) ×2
SUT VIC AB 1 CT1 27XBRD ANBCTR (SUTURE) ×2 IMPLANT
SUT VIC AB 2-0 CT1 27 (SUTURE) ×2
SUT VIC AB 2-0 CT1 TAPERPNT 27 (SUTURE) ×2 IMPLANT
SYR 20CC LL (SYRINGE) ×4 IMPLANT
SYR TB 1ML 25GX5/8 (SYRINGE) ×2 IMPLANT
SYR TB 1ML LUER SLIP (SYRINGE) IMPLANT
TOWEL OR 17X24 6PK STRL BLUE (TOWEL DISPOSABLE) ×2 IMPLANT
TOWEL OR 17X26 10 PK STRL BLUE (TOWEL DISPOSABLE) ×2 IMPLANT
TRAY CATH 16FR W/PLASTIC CATH (SET/KITS/TRAYS/PACK) ×2 IMPLANT
WRAP KNEE MAXI GEL POST OP (GAUZE/BANDAGES/DRESSINGS) ×2 IMPLANT

## 2016-11-06 NOTE — Evaluation (Signed)
Physical Therapy Evaluation Patient Details Name: Deanna Allen MRN: [id number] DOB: [birth year] Today's Date: 11/06/2016   History of Present Illness  Pt is a 66 y/o female s/p elective R TKA secondary to R OA. PMH includes anxiety, skin cancer, L ankle injury, and R knee arthroscopy.   Clinical Impression  Pt is s/p elective surgery above with deficits below. PTA, pt was independent with all functional mobility. Upon evaluation, pt limited by post op pain and weakness. Pt currently requiring min guard assist for mobility. Expect pt to progress very well with mobility. Pt reporting sister will be moving in with her to take care of her. Follow up recommendations below to increase independence and safety with functional mobility at home. Will continue to follow to maximize functional mobility independence.     Follow Up Recommendations Home health PT;Supervision/Assistance - 24 hour    Equipment Recommendations  None recommended by PT    Recommendations for Other Services       Precautions / Restrictions Precautions Precautions: Knee Precaution Booklet Issued: Yes (comment) Precaution Comments: Reviewed supine ther ex with pt.  Restrictions Weight Bearing Restrictions: Yes RLE Weight Bearing: Weight bearing as tolerated      Mobility  Bed Mobility Overal bed mobility: Needs Assistance Bed Mobility: Supine to Sit     Supine to sit: Supervision;HOB elevated     General bed mobility comments: Supervision for safety. Verbal cues for technique.   Transfers Overall transfer level: Needs assistance Equipment used: Rolling walker (2 wheeled) Transfers: Sit to/from Stand Sit to Stand: Min guard         General transfer comment: Min guard for safety. Verbal cues for hand placement prior to transfer. Verbal cues for appropriate LE placement.   Ambulation/Gait Ambulation/Gait assistance: Min guard Ambulation Distance (Feet): 30 Feet Assistive device: Rolling walker (2  wheeled) Gait Pattern/deviations: Step-to pattern;Decreased step length - right;Decreased stance time - right;Antalgic Gait velocity: Decreased Gait velocity interpretation: Below normal speed for age/gender General Gait Details: Slow, guarded gait. Antalgic gait pattern secondary to post op pain and weakness. Verbal cues for sequencing with RW during ambulation.   Stairs            Wheelchair Mobility    Modified Rankin (Stroke Patients Only)       Balance Overall balance assessment: Needs assistance Sitting-balance support: No upper extremity supported;Feet supported Sitting balance-Leahy Scale: Good     Standing balance support: Bilateral upper extremity supported Standing balance-Leahy Scale: Poor Standing balance comment: Reliant on RW for steadying                              Pertinent Vitals/Pain Pain Assessment: 0-10 Pain Score: 2  Pain Location: R knee Pain Descriptors / Indicators: Sore;Operative site guarding Pain Intervention(s): Limited activity within patient's tolerance;Monitored during session;Repositioned    Home Living Family/patient expects to be discharged to:: Private residence Living Arrangements: Other relatives (sister) Available Help at Discharge: Family;Available 24 hours/day Type of Home: House Home Access: Other (comment) (threshold )     Home Layout: Two level;Able to live on main level with bedroom/bathroom Home Equipment: Gilford Rile - 2 wheels;Bedside commode;Grab bars - tub/shower      Prior Function Level of Independence: Independent               Hand Dominance   Dominant Hand: Right    Extremity/Trunk Assessment   Upper Extremity Assessment Upper Extremity Assessment: Defer to OT evaluation  Lower Extremity Assessment Lower Extremity Assessment: RLE deficits/detail RLE Deficits / Details: Sensory in tact. Deficits consistent with post op pain and weakness.     Cervical / Trunk Assessment Cervical  / Trunk Assessment: Normal  Communication   Communication: No difficulties  Cognition Arousal/Alertness: Awake/alert Behavior During Therapy: WFL for tasks assessed/performed Overall Cognitive Status: Within Functional Limits for tasks assessed                                        General Comments General comments (skin integrity, edema, etc.): Educated about supine ther ex program. Verbal cues for technique.     Exercises Total Joint Exercises Ankle Circles/Pumps: AROM;Both;15 reps;Supine Quad Sets: AROM;Right;10 reps;Supine Towel Squeeze: AROM;Both;10 reps;Supine Hip ABduction/ADduction: AROM;Right;10 reps;Supine Straight Leg Raises: AROM;Right;5 reps;Supine   Assessment/Plan    PT Assessment Patient needs continued PT services  PT Problem List Decreased strength;Decreased range of motion;Decreased balance;Decreased mobility;Decreased knowledge of use of DME;Decreased knowledge of precautions;Pain       PT Treatment Interventions DME instruction;Gait training;Stair training;Functional mobility training;Therapeutic activities;Therapeutic exercise;Balance training;Neuromuscular re-education;Patient/family education    PT Goals (Current goals can be found in the Care Plan section)  Acute Rehab PT Goals Patient Stated Goal: to go home  PT Goal Formulation: With patient Time For Goal Achievement: 11/13/16 Potential to Achieve Goals: Good    Frequency 7X/week   Barriers to discharge        Co-evaluation               End of Session Equipment Utilized During Treatment: Gait belt Activity Tolerance: Patient tolerated treatment well Patient left: in chair;with call bell/phone within reach Nurse Communication: Mobility status PT Visit Diagnosis: Other abnormalities of gait and mobility (R26.89);Pain Pain - Right/Left: Right Pain - part of body: Knee    Time: 5681-2751 PT Time Calculation (min) (ACUTE ONLY): 30 min   Charges:   PT  Evaluation $PT Eval Low Complexity: 1 Procedure PT Treatments $Gait Training: 8-22 mins   PT G Codes:   PT G-Codes **NOT FOR INPATIENT CLASS** Functional Assessment Tool Used: AM-PAC 6 Clicks Basic Mobility;Clinical judgement Functional Limitation: Mobility: Walking and moving around Mobility: Walking and Moving Around Current Status (Z0017): At least 40 percent but less than 60 percent impaired, limited or restricted Mobility: Walking and Moving Around Goal Status [phone]): At least 1 percent but less than 20 percent impaired, limited or restricted    Nicky Pugh, PT, DPT  Acute Rehabilitation Services  Pager: [place] 11/06/2016, 3:56 PM

## 2016-11-06 NOTE — Anesthesia Preprocedure Evaluation (Signed)
Anesthesia Evaluation  Patient identified by MRN, date of birth, ID band Patient awake    Reviewed: Allergy & Precautions, NPO status , Patient's Chart, lab work & pertinent test results  Airway Mallampati: II  TM Distance: >3 FB Neck ROM: Full    Dental no notable dental hx.    Pulmonary neg pulmonary ROS,    Pulmonary exam normal breath sounds clear to auscultation       Cardiovascular negative cardio ROS Normal cardiovascular exam Rhythm:Regular Rate:Normal     Neuro/Psych negative neurological ROS  negative psych ROS   GI/Hepatic negative GI ROS, Neg liver ROS,   Endo/Other  negative endocrine ROS  Renal/GU negative Renal ROS  negative genitourinary   Musculoskeletal negative musculoskeletal ROS (+)   Abdominal   Peds negative pediatric ROS (+)  Hematology negative hematology ROS (+)   Anesthesia Other Findings   Reproductive/Obstetrics negative OB ROS                             Anesthesia Physical Anesthesia Plan  ASA: II  Anesthesia Plan: Spinal   Post-op Pain Management:  Regional for Post-op pain   Induction: Intravenous  Airway Management Planned: Simple Face Mask  Additional Equipment:   Intra-op Plan:   Post-operative Plan: Extubation in OR  Informed Consent: I have reviewed the patients History and Physical, chart, labs and discussed the procedure including the risks, benefits and alternatives for the proposed anesthesia with the patient or authorized representative who has indicated his/her understanding and acceptance.   Dental advisory given  Plan Discussed with: CRNA and Surgeon  Anesthesia Plan Comments:         Anesthesia Quick Evaluation

## 2016-11-06 NOTE — Transfer of Care (Signed)
Immediate Anesthesia Transfer of Care Note  Patient: Deanna Allen  Procedure(s) Performed: Procedure(s): RIGHT TOTAL KNEE ARTHROPLASTY (Right)  Patient Location: PACU  Anesthesia Type:Spinal and MAC combined with regional for post-op pain  Level of Consciousness: alert , oriented and patient cooperative  Airway & Oxygen Therapy: Patient Spontanous Breathing and Patient connected to nasal cannula oxygen  Post-op Assessment: Report given to RN, Post -op Vital signs reviewed and stable and Patient moving all extremities  Post vital signs: Reviewed and stable  Last Vitals:  Vitals:   11/06/16 0740 11/06/16 0745  BP: (!) 169/68 (!) 173/76  Pulse: 76 72  Resp: 15 14  Temp:      Last Pain:  Vitals:   11/06/16 0633  TempSrc: Oral         Complications: No apparent anesthesia complications

## 2016-11-06 NOTE — Progress Notes (Signed)
Orthopedic Tech Progress Note Patient Details:  Deanna Allen 1950/09/03 [id number]  CPM Right Knee CPM Right Knee: On Right Knee Flexion (Degrees): 90 Right Knee Extension (Degrees): 0 Additional Comments: foot roll   [name] 11/06/2016, 11:12 AM

## 2016-11-06 NOTE — Anesthesia Postprocedure Evaluation (Addendum)
Anesthesia Post Note  Patient: Deanna Allen  Procedure(s) Performed: Procedure(s) (LRB): RIGHT TOTAL KNEE ARTHROPLASTY (Right)  Patient location during evaluation: PACU Anesthesia Type: Spinal Level of consciousness: oriented and awake and alert Pain management: pain level controlled Vital Signs Assessment: post-procedure vital signs reviewed and stable Respiratory status: spontaneous breathing, respiratory function stable and patient connected to nasal cannula oxygen Cardiovascular status: blood pressure returned to baseline and stable Postop Assessment: no headache and no backache Anesthetic complications: no       Last Vitals:  Vitals:   11/06/16 1110 11/06/16 1115  BP: (!) 148/71   Pulse: 86 76  Resp: 16 15  Temp:  36.1 C    Last Pain:  Vitals:   11/06/16 0633  TempSrc: Oral                 Crystallee Werden S

## 2016-11-06 NOTE — Anesthesia Procedure Notes (Signed)
Anesthesia Regional Block: Adductor canal block   Pre-Anesthetic Checklist: ,, timeout performed, Correct Patient, Correct Site, Correct Laterality, Correct Procedure, Correct Position, site marked, Risks and benefits discussed,  Surgical consent,  Pre-op evaluation,  At surgeon's request and post-op pain management  Laterality: Right  Prep: chloraprep       Needles:  Injection technique: Single-shot  Needle Type: Echogenic Needle     Needle Length: 9cm      Additional Needles:   Procedures: ultrasound guided,,,,,,,,  Narrative:  Start time: 11/06/2016 7:25 AM End time: 11/06/2016 7:33 AM Injection made incrementally with aspirations every 5 mL.  Performed by: Personally   Additional Notes: Patient tolerated the procedure well without complications

## 2016-11-07 ENCOUNTER — Encounter (HOSPITAL_COMMUNITY): Payer: Self-pay | Admitting: Orthopedic Surgery

## 2016-11-07 DIAGNOSIS — Z96651 Presence of right artificial knee joint: Secondary | ICD-10-CM | POA: Diagnosis not present

## 2016-11-07 DIAGNOSIS — M1711 Unilateral primary osteoarthritis, right knee: Secondary | ICD-10-CM | POA: Diagnosis not present

## 2016-11-07 LAB — BASIC METABOLIC PANEL
Anion gap: 9 (ref 5–15)
BUN: 14 mg/dL (ref 6–20)
CALCIUM: 9.4 mg/dL (ref 8.9–10.3)
CHLORIDE: 103 mmol/L (ref 101–111)
CO2: 26 mmol/L (ref 22–32)
CREATININE: 0.9 mg/dL (ref 0.44–1.00)
Glucose, Bld: 133 mg/dL — ABNORMAL HIGH (ref 65–99)
Potassium: 5.1 mmol/L (ref 3.5–5.1)
SODIUM: 138 mmol/L (ref 135–145)

## 2016-11-07 LAB — CBC
HCT: 36.5 % (ref 36.0–46.0)
Hemoglobin: 12.2 g/dL (ref 12.0–15.0)
MCH: 30.1 pg (ref 26.0–34.0)
MCHC: 33.4 g/dL (ref 30.0–36.0)
MCV: 90.1 fL (ref 78.0–100.0)
PLATELETS: 204 10*3/uL (ref 150–400)
RBC: 4.05 MIL/uL (ref 3.87–5.11)
RDW: 13.2 % (ref 11.5–15.5)
WBC: 13.9 10*3/uL — ABNORMAL HIGH (ref 4.0–10.5)

## 2016-11-07 MED ORDER — METHOCARBAMOL 500 MG PO TABS: 500.0000 mg | ORAL_TABLET | Freq: Four times a day (QID) | ORAL | 0 refills | Status: DC | PRN

## 2016-11-07 MED ORDER — OXYCODONE HCL 10 MG PO TABS: 10.0000 mg | ORAL_TABLET | ORAL | 0 refills | Status: DC | PRN

## 2016-11-07 MED ORDER — ASPIRIN 325 MG PO TBEC: 325.0000 mg | DELAYED_RELEASE_TABLET | Freq: Two times a day (BID) | ORAL | 0 refills | Status: DC

## 2016-11-07 NOTE — Care Management Note (Signed)
Case Management Note  Patient Details  Name: Deanna Allen MRN: [id number] Date of Birth: 1950/12/15  Subjective/Objective:     66 yr old female s/p right total knee arthroplasty.               Action/Plan: Case manager spoke with patient concerning [place] and DME needs. Patient was initially assigned to [facility], CM received notification that they cannot accept patient. [place] for [facility] was offered. Referral was called to Ste[facility], [place]. Patient has RW and 3in1, CPM has been delivered to her home. Patient states she will have family support at discharge.    Expected Discharge Date:  11/07/16               Expected Discharge Plan:  [place]  In-House Referral:  NA  Discharge planning Services  CM Consult  Post Acute Care Choice:  Home Health, Durable Medical Equipment Choice offered to:  Patient  DME Arranged:  CPM (has RW and 3in1) DME Agency:  [employer]  HH Arranged:  PT [place]:  [place]  Status of Service:  Completed, signed off  If discussed at [place] of Stay Meetings, dates discussed:    Additional Comments:  Ninfa Meeker, RN 11/07/2016, 2:50 PM

## 2016-11-07 NOTE — Op Note (Signed)
TOTAL KNEE REPLACEMENT OPERATIVE NOTE:  11/06/2016  1:43 PM  PATIENT:  Deanna Allen  66 y.o. female  PRE-OPERATIVE DIAGNOSIS:  primary osteoarthritis right knee  POST-OPERATIVE DIAGNOSIS:  primary osteoarthritis right knee  PROCEDURE:  Procedure(s): RIGHT TOTAL KNEE ARTHROPLASTY  SURGEON:  Surgeon(s): Vickey Huger, MD  PHYSICIAN ASSISTANT:  Nehemiah Massed, [facility]  ANESTHESIA:   spinal  DRAINS: Hemovac  SPECIMEN: None  COUNTS:  Correct  TOURNIQUET:   Total Tourniquet Time Documented: Thigh (Right) - 44 minutes Total: Thigh (Right) - 44 minutes   DICTATION:  Indication for procedure:    The patient is a 66 y.o. female who has failed conservative treatment for primary osteoarthritis right knee.  Informed consent was obtained prior to anesthesia. The risks versus benefits of the operation were explain and in a way the patient can, and did, understand.   On the implant demand matching protocol, this patient scored 10.  Therefore, this patient was not receive a polyethylene insert with vitamin E which is a high demand implant.  Description of procedure:     The patient was taken to the operating room and placed under anesthesia.  The patient was positioned in the usual fashion taking care that all body parts were adequately padded and/or protected.  I foley catheter was not placed.  A tourniquet was applied and the leg prepped and draped in the usual sterile fashion.  The extremity was exsanguinated with the esmarch and tourniquet inflated to 350 mmHg.  Pre-operative range of motion was normal.  The knee was in 5 degree of mild valgus.  A midline incision approximately 6-7 inches long was made with a #10 blade.  A new blade was used to make a parapatellar arthrotomy going 2-3 cm into the quadriceps tendon, over the patella, and alongside the medial aspect of the patellar tendon.  A synovectomy was then performed with the #10 blade and forceps. I then elevated the deep MCL off  the medial tibial metaphysis subperiosteally around to the semimembranosus attachment.    I everted the patella and used calipers to measure patellar thickness.  I used the reamer to ream down to appropriate thickness to recreate the native thickness.  I then removed excess bone with the rongeur and sagittal saw.  I used the appropriately sized template and drilled the three lug holes.  I then put the trial in place and measured the thickness with the calipers to ensure recreation of the native thickness.  The trial was then removed and the patella subluxed and the knee brought into flexion.  A homan retractor was place to retract and protect the patella and lateral structures.  A Z-retractor was place medially to protect the medial structures.  The extra-medullary alignment system was used to make cut the tibial articular surface perpendicular to the anamotic axis of the tibia and in 3 degrees of posterior slope.  The cut surface and alignment jig was removed.  I then used the intramedullary alignment guide to make a 6 valgus cut on the distal femur.  I then marked out the epicondylar axis on the distal femur.  The posterior condylar axis measured 3 degrees.  I then used the anterior referencing sizer and measured the femur to be a size 7.  The 4-In-1 cutting block was screwed into place in external rotation matching the posterior condylar angle, making our cuts perpendicular to the epicondylar axis.  Anterior, posterior and chamfer cuts were made with the sagittal saw.  The cutting block and cut  pieces were removed.  A lamina spreader was placed in 90 degrees of flexion.  The ACL, PCL, menisci, and posterior condylar osteophytes were removed.  A 13 mm spacer blocked was found to offer good flexion and extension gap balance after mild in degree releasing.   The scoop retractor was then placed and the femoral finishing block was pinned in place.  The small sagittal saw was used as well as the lug drill to  finish the femur.  The block and cut surfaces were removed and the medullary canal hole filled with autograft bone from the cut pieces.  The tibia was delivered forward in deep flexion and external rotation.  A size E tray was selected and pinned into place centered on the medial 1/3 of the tibial tubercle.  The reamer and keel was used to prepare the tibia through the tray.    I then trialed with the size 7 femur, size E tibia, a 13 mm insert and the 32 patella.  I had excellent flexion/extension gap balance, excellent patella tracking.  Flexion was full and beyond 120 degrees; extension was zero.  These components were chosen and the staff opened them to me on the back table while the knee was lavaged copiously and the cement mixed.  The soft tissue was infiltrated with 60cc of exparel 1.3% through a 21 gauge needle.  I cemented in the components and removed all excess cement.  The polyethylene tibial component was snapped into place and the knee placed in extension while cement was hardening.  The capsule was infilltrated with 30cc of .25% Marcaine with epinephrine.  A hemovac was place in the joint exiting superolaterally.  A pain pump was place superomedially superficial to the arthrotomy.  Once the cement was hard, the tourniquet was let down.  Hemostasis was obtained.  The arthrotomy was closed with figure-8 #1 vicryl sutures.  The deep soft tissues were closed with #0 vicryls and the subcuticular layer closed with a running #2-0 vicryl.  The skin was reapproximated and closed with skin staples.  The wound was dressed with xeroform, 4 x4's, 2 ABD sponges, a single layer of webril and a TED stocking.   The patient was then awakened, extubated, and taken to the recovery room in stable condition.  BLOOD LOSS:  300cc DRAINS: 1 hemovac, 1 pain catheter COMPLICATIONS:  None.  PLAN OF CARE: Admit to inpatient   PATIENT DISPOSITION:  PACU - hemodynamically stable.   Delay start of Pharmacological  VTE agent (>24hrs) due to surgical blood loss or risk of bleeding:  not applicable  Please fax a copy of this op note to my office at [phone] (please only include page 1 and 2 of the Case Information op note)

## 2016-11-07 NOTE — Discharge Summary (Signed)
SPORTS MEDICINE & JOINT REPLACEMENT   Lara Mulch, MD   Carlyon Shadow, PA-C [address], [place], [place]  [address]                             [phone]  PATIENT ID: Deanna Allen        MRN:  [id number]          DOB/AGE: November 05, 1950 / 66 y.o.    DISCHARGE SUMMARY  ADMISSION DATE:    11/06/2016 DISCHARGE DATE:   11/07/2016   ADMISSION DIAGNOSIS: primary osteoarthritis right knee    DISCHARGE DIAGNOSIS:  primary osteoarthritis right knee    ADDITIONAL DIAGNOSIS: Active Problems:   S/P total knee replacement  Past Medical History:  Diagnosis Date  . Anxiety   . Arthritis    RIGHT knee, bone on bone  . Cancer (HCC)    basal cell CA-face  . Family history of adverse reaction to anesthesia    sister n&v  . Hepatitis    as a baby  . Hyperlipidemia   . Left ankle injury 12/11/2012    PROCEDURE: Procedure(s): RIGHT TOTAL KNEE ARTHROPLASTY on 11/06/2016  CONSULTS:    HISTORY:  See H&P in chart  HOSPITAL COURSE:  DENETTA FEI is a 66 y.o. admitted on 11/06/2016 and found to have a diagnosis of primary osteoarthritis right knee.  After appropriate laboratory studies were obtained  they were taken to the operating room on 11/06/2016 and underwent Procedure(s): RIGHT TOTAL KNEE ARTHROPLASTY.   They were given perioperative antibiotics:  Anti-infectives    Start     Dose/Rate Route Frequency Ordered Stop   11/06/16 0800  ceFAZolin (ANCEF) IVPB 2g/100 mL premix     2 g 200 mL/hr over 30 Minutes Intravenous To ShortStay Surgical 11/03/16 0959 11/06/16 0836    .  Patient given tranexamic acid IV or topical and exparel intra-operatively.  Tolerated the procedure well.    POD# 1: Vital signs were stable.  Patient denied Chest pain, shortness of breath, or calf pain.  Patient was started on Lovenox 30 mg subcutaneously twice daily at 8am.  Consults to PT, OT, and care management were made.  The patient was weight bearing as tolerated.  CPM was placed on  the operative leg 0-90 degrees for 6-8 hours a day. When out of the CPM, patient was placed in the foam block to achieve full extension. Incentive spirometry was taught.  Dressing was changed.       POD #2, Continued  PT for ambulation and exercise program.  IV saline locked.  O2 discontinued.    The remainder of the hospital course was dedicated to ambulation and strengthening.   The patient was discharged on 1 Day Post-Op in  Good condition.  Blood products given:none  DIAGNOSTIC STUDIES: Recent vital signs: Patient Vitals for the past 24 hrs:  BP Temp Temp src Pulse Resp SpO2  11/07/16 0542 131/62 97.8 F (36.6 C) Oral 81 15 94 %  11/07/16 0038 124/67 98.1 F (36.7 C) Oral 82 15 96 %  11/06/16 2013 127/74 98.1 F (36.7 C) Oral 80 15 96 %       Recent laboratory studies:  Recent Labs  11/07/16 0622  WBC 13.9*  HGB 12.2  HCT 36.5  PLT 204    Recent Labs  11/07/16 0622  NA 138  K 5.1  CL 103  CO2 26  BUN 14  CREATININE 0.90  GLUCOSE 133*  CALCIUM 9.4   No results found for: INR, PROTIME   Recent Radiographic Studies :  No results found.  DISCHARGE INSTRUCTIONS: Discharge Instructions    CPM    Complete by:  As directed    Continuous passive motion machine (CPM):      Use the CPM from 0 to 90 for 4-6 hours per day.      You may increase by 10 per day.  You may break it up into 2 or 3 sessions per day.      Use CPM for 2 weeks or until you are told to stop.   Call MD / Call 911    Complete by:  As directed    If you experience chest pain or shortness of breath, CALL 911 and be transported to the hospital emergency room.  If you develope a fever above 101 F, pus (white drainage) or increased drainage or redness at the wound, or calf pain, call your surgeon's office.   Constipation Prevention    Complete by:  As directed    Drink plenty of fluids.  Prune juice may be helpful.  You may use a stool softener, such as Colace (over the counter) 100 mg twice a day.   Use MiraLax (over the counter) for constipation as needed.   Diet - low sodium heart healthy    Complete by:  As directed    Discharge instructions    Complete by:  As directed    INSTRUCTIONS AFTER JOINT REPLACEMENT   Remove items at home which could result in a fall. This includes throw rugs or furniture in walking pathways ICE to the affected joint every three hours while awake for 30 minutes at a time, for at least the first 3-5 days, and then as needed for pain and swelling.  Continue to use ice for pain and swelling. You may notice swelling that will progress down to the foot and ankle.  This is normal after surgery.  Elevate your leg when you are not up walking on it.   Continue to use the breathing machine you got in the hospital (incentive spirometer) which will help keep your temperature down.  It is common for your temperature to cycle up and down following surgery, especially at night when you are not up moving around and exerting yourself.  The breathing machine keeps your lungs expanded and your temperature down.   DIET:  As you were doing prior to hospitalization, we recommend a well-balanced diet.  DRESSING / WOUND CARE / SHOWERING  Keep the surgical dressing until follow up.  The dressing is water proof, so you can shower without any extra covering.  IF THE DRESSING FALLS OFF or the wound gets wet inside, change the dressing with sterile gauze.  Please use good hand washing techniques before changing the dressing.  Do not use any lotions or creams on the incision until instructed by your surgeon.    ACTIVITY  Increase activity slowly as tolerated, but follow the weight bearing instructions below.   No driving for 6 weeks or until further direction given by your physician.  You cannot drive while taking narcotics.  No lifting or carrying greater than 10 lbs. until further directed by your surgeon. Avoid periods of inactivity such as sitting longer than an hour when not asleep.  This helps prevent blood clots.  You may return to work once you are authorized by your doctor.     WEIGHT BEARING   Weight  bearing as tolerated with assist device (walker, cane, etc) as directed, use it as long as suggested by your surgeon or therapist, typically at least 4-6 weeks.   EXERCISES  Results after joint replacement surgery are often greatly improved when you follow the exercise, range of motion and muscle strengthening exercises prescribed by your doctor. Safety measures are also important to protect the joint from further injury. Any time any of these exercises cause you to have increased pain or swelling, decrease what you are doing until you are comfortable again and then slowly increase them. If you have problems or questions, call your caregiver or physical therapist for advice.   Rehabilitation is important following a joint replacement. After just a few days of immobilization, the muscles of the leg can become weakened and shrink (atrophy).  These exercises are designed to build up the tone and strength of the thigh and leg muscles and to improve motion. Often times heat used for twenty to thirty minutes before working out will loosen up your tissues and help with improving the range of motion but do not use heat for the first two weeks following surgery (sometimes heat can increase post-operative swelling).   These exercises can be done on a training (exercise) mat, on the floor, on a table or on a bed. Use whatever works the best and is most comfortable for you.    Use music or television while you are exercising so that the exercises are a pleasant break in your day. This will make your life better with the exercises acting as a break in your routine that you can look forward to.   Perform all exercises about fifteen times, three times per day or as directed.  You should exercise both the operative leg and the other leg as well.   Exercises include:   Quad Sets - Tighten up  the muscle on the front of the thigh (Quad) and hold for 5-10 seconds.   Straight Leg Raises - With your knee straight (if you were given a brace, keep it on), lift the leg to 60 degrees, hold for 3 seconds, and slowly lower the leg.  Perform this exercise against resistance later as your leg gets stronger.  Leg Slides: Lying on your back, slowly slide your foot toward your buttocks, bending your knee up off the floor (only go as far as is comfortable). Then slowly slide your foot back down until your leg is flat on the floor again.  Angel Wings: Lying on your back spread your legs to the side as far apart as you can without causing discomfort.  Hamstring Strength:  Lying on your back, push your heel against the floor with your leg straight by tightening up the muscles of your buttocks.  Repeat, but this time bend your knee to a comfortable angle, and push your heel against the floor.  You may put a pillow under the heel to make it more comfortable if necessary.   A rehabilitation program following joint replacement surgery can speed recovery and prevent re-injury in the future due to weakened muscles. Contact your doctor or a physical therapist for more information on knee rehabilitation.    CONSTIPATION  Constipation is defined medically as fewer than three stools per week and severe constipation as less than one stool per week.  Even if you have a regular bowel pattern at home, your normal regimen is likely to be disrupted due to multiple reasons following surgery.  Combination of anesthesia, postoperative narcotics,  change in appetite and fluid intake all can affect your bowels.   YOU MUST use at least one of the following options; they are listed in order of increasing strength to get the job done.  They are all available over the counter, and you may need to use some, POSSIBLY even all of these options:    Drink plenty of fluids (prune juice may be helpful) and high fiber foods Colace 100 mg by  mouth twice a day  Senokot for constipation as directed and as needed Dulcolax (bisacodyl), take with full glass of water  Miralax (polyethylene glycol) once or twice a day as needed.  If you have tried all these things and are unable to have a bowel movement in the first 3-4 days after surgery call either your surgeon or your primary doctor.    If you experience loose stools or diarrhea, hold the medications until you stool forms back up.  If your symptoms do not get better within 1 week or if they get worse, check with your doctor.  If you experience "the worst abdominal pain ever" or develop nausea or vomiting, please contact the office immediately for further recommendations for treatment.   ITCHING:  If you experience itching with your medications, try taking only a single pain pill, or even half a pain pill at a time.  You can also use Benadryl over the counter for itching or also to help with sleep.   TED HOSE STOCKINGS:  Use stockings on both legs until for at least 2 weeks or as directed by physician office. They may be removed at night for sleeping.  MEDICATIONS:  See your medication summary on the "After Visit Summary" that nursing will review with you.  You may have some home medications which will be placed on hold until you complete the course of blood thinner medication.  It is important for you to complete the blood thinner medication as prescribed.  PRECAUTIONS:  If you experience chest pain or shortness of breath - call 911 immediately for transfer to the hospital emergency department.   If you develop a fever greater that 101 F, purulent drainage from wound, increased redness or drainage from wound, foul odor from the wound/dressing, or calf pain - CONTACT YOUR SURGEON.                                                   FOLLOW-UP APPOINTMENTS:  If you do not already have a post-op appointment, please call the office for an appointment to be seen by your surgeon.  Guidelines for  how soon to be seen are listed in your "After Visit Summary", but are typically between 1-4 weeks after surgery.  OTHER INSTRUCTIONS:   Knee Replacement:  Do not place pillow under knee, focus on keeping the knee straight while resting. CPM instructions: 0-90 degrees, 2 hours in the morning, 2 hours in the afternoon, and 2 hours in the evening. Place foam block, curve side up under heel at all times except when in CPM or when walking.  DO NOT modify, tear, cut, or change the foam block in any way.  MAKE SURE YOU:  Understand these instructions.  Get help right away if you are not doing well or get worse.    Thank you for letting us be a part of your medical care team.  It is a privilege we respect greatly.  We hope these instructions will help you stay on track for a fast and full recovery!   Increase activity slowly as tolerated    Complete by:  As directed       DISCHARGE MEDICATIONS:   Allergies as of 11/07/2016      Reactions   No Known Allergies       Medication List    TAKE these medications   aspirin 325 MG EC tablet Take 1 tablet (325 mg total) by mouth 2 (two) times daily.   calcium carbonate 600 MG Tabs tablet Commonly known as:  OS-CAL Take 600 mg by mouth 2 (two) times daily with a meal.   CO Q 10 PO Take 1 capsule by mouth daily.   [facility] GLUCOSAMINE 1500 MG Tabs Generic drug:  Glucosamine HCl Take 1,500 mg by mouth 2 (two) times daily.   ezetimibe 10 MG tablet Commonly known as:  ZETIA Take 1 tablet (10 mg total) by mouth daily.   Krill Oil 500 MG Caps Take 500 mg by mouth daily.   LORazepam 2 MG tablet Commonly known as:  ATIVAN TAKE 1/2 TO 1 TABLET BY MOUTH AT BEDTIME AS NEEDED FOR SLEEP What changed:  See the new instructions.   methocarbamol 500 MG tablet Commonly known as:  ROBAXIN Take 1-2 tablets (500-1,000 mg total) by mouth every 6 (six) hours as needed for muscle spasms.   multivitamin with minerals Tabs tablet Take 1 tablet by mouth  daily.   Oxycodone HCl 10 MG Tabs Take 1 tablet (10 mg total) by mouth every 3 (three) hours as needed for breakthrough pain.   sertraline 25 MG tablet Commonly known as:  ZOLOFT take 1 tablet by mouth once daily What changed:  See the new instructions.   Vitamin D-3 5000 units Tabs Take 5,000 mg by mouth daily.            Durable Medical Equipment        Start     Ordered   11/06/16 1156  DME Walker rolling  Once    Question:  Patient needs a walker to treat with the following condition  Answer:  S/P total knee replacement   11/06/16 1155   11/06/16 1156  DME 3 n 1  Once     11/06/16 1155   11/06/16 1156  DME Bedside commode  Once    Question:  Patient needs a bedside commode to treat with the following condition  Answer:  S/P total knee replacement   11/06/16 1155      FOLLOW UP VISIT:    DISPOSITION: HOME VS. SNF  CONDITION:  Good   Donia Ast 11/07/2016, 11:44 AM

## 2016-11-07 NOTE — Progress Notes (Signed)
Physical Therapy Treatment Patient Details Name: Deanna Allen MRN: [id number] DOB: Dec 26, 1950 Today's Date: 11/07/2016    History of Present Illness Pt is a 66 y/o female s/p elective R TKA secondary to R OA. PMH includes anxiety, skin cancer, L ankle injury, and R knee arthroscopy.     PT Comments    Patient is progressing well toward mobility goals. Pt able to increase gait distance and complete HEP this session. Current plan remains appropriate.    Follow Up Recommendations  Home health PT;Supervision/Assistance - 24 hour     Equipment Recommendations  None recommended by PT    Recommendations for Other Services       Precautions / Restrictions Precautions Precautions: Knee Precaution Booklet Issued: Yes (comment) Precaution Comments: reviewed precautions/positioning with pt Restrictions Weight Bearing Restrictions: Yes RLE Weight Bearing: Weight bearing as tolerated    Mobility  Bed Mobility Overal bed mobility: Needs Assistance Bed Mobility: Supine to Sit     Supine to sit: Modified independent (Device/Increase time);HOB elevated     General bed mobility comments: pt OOB in chair upon arrival  Transfers Overall transfer level: Needs assistance Equipment used: Rolling walker (2 wheeled) Transfers: Sit to/from Stand Sit to Stand: Supervision Stand pivot transfers: Supervision       General transfer comment: cues for hand placement  Ambulation/Gait Ambulation/Gait assistance: Supervision Ambulation Distance (Feet): 200 Feet Assistive device: Rolling walker (2 wheeled) Gait Pattern/deviations: Step-through pattern;Decreased stride length Gait velocity: Decreased   General Gait Details: steady gait and improved weightbearing and step length symmetry   Stairs Stairs: Yes   Stair Management: No rails;Forwards;With walker;Step to pattern Number of Stairs: 1 General stair comments: cues for sequencing and technique  Wheelchair Mobility     Modified Rankin (Stroke Patients Only)       Balance Overall balance assessment: Needs assistance Sitting-balance support: No upper extremity supported;Feet supported Sitting balance-Leahy Scale: Good     Standing balance support: Single extremity supported;During functional activity Standing balance-Leahy Scale: Fair Standing balance comment: able to static stand without UE support                            Cognition Arousal/Alertness: Awake/alert Behavior During Therapy: WFL for tasks assessed/performed Overall Cognitive Status: Within Functional Limits for tasks assessed                                        Exercises Total Joint Exercises Quad Sets: AROM;Right;10 reps Short Arc Quad: AROM;Right;10 reps Heel Slides: AROM;Right;10 reps Hip ABduction/ADduction: AROM;Right;10 reps Straight Leg Raises: AROM;Right;10 reps Long Arc Quad: AROM;Right;10 reps Knee Flexion: AROM;Right;10 reps;Seated (10 second holds) Goniometric ROM: 5-92    General Comments        Pertinent Vitals/Pain Pain Assessment: Faces Pain Score: 3  Faces Pain Scale: Hurts little more Pain Location: R knee Pain Descriptors / Indicators: Sore;Operative site guarding Pain Intervention(s): Limited activity within patient's tolerance;Monitored during session;Premedicated before session;Repositioned;Ice applied    Home Living Family/patient expects to be discharged to:: Private residence Living Arrangements: Other relatives (Sister to assist) Available Help at Discharge: Family;Available 24 hours/day Type of Home: House Home Access: Other (comment) (threshold through garage)   Home Layout: Two level;Able to live on main level with bedroom/bathroom Home Equipment: Gilford Rile - 2 wheels;Bedside commode;Grab bars - tub/shower      Prior Function Level of Independence:  Independent          PT Goals (current goals can now be found in the care plan section) Acute Rehab  PT Goals Patient Stated Goal: to go home  PT Goal Formulation: With patient Time For Goal Achievement: 11/13/16 Potential to Achieve Goals: Good Progress towards PT goals: Progressing toward goals    Frequency    7X/week      PT Plan Current plan remains appropriate    Co-evaluation             End of Session Equipment Utilized During Treatment: Gait belt Activity Tolerance: Patient tolerated treatment well Patient left: in chair;with call bell/phone within reach Nurse Communication: Mobility status PT Visit Diagnosis: Other abnormalities of gait and mobility (R26.89);Pain Pain - Right/Left: Right Pain - part of body: Knee     Time: 1025-1100 PT Time Calculation (min) (ACUTE ONLY): 35 min  Charges:  $Gait Training: 8-22 mins $Therapeutic Exercise: 8-22 mins                    G Codes:       Earney Navy, PTA Pager: [phone]     Darliss Cheney 11/07/2016, 11:09 AM

## 2016-11-07 NOTE — Progress Notes (Signed)
Pt discharge education and instructions completed with pt at bedside; pt voices understanding and denies any questions. Pt IV removed; pt left knee incision dsg remains clean, dry and intact. Pt handed her prescriptions for aspirin, Robaxin and oxycodone. Pt discharge home with family to transport her home. Pt transported off unit via wheelchair with belongings to the side. Francis Gaines Grant Henkes RN.

## 2016-11-07 NOTE — Evaluation (Signed)
Occupational Therapy Evaluation Patient Details Name: Deanna Allen MRN: [id number] DOB: [birth year] Today's Date: 11/07/2016    History of Present Illness Pt is a 66 y/o female s/p elective R TKA secondary to R OA. PMH includes anxiety, skin cancer, L ankle injury, and R knee arthroscopy.    Clinical Impression   Pt admitted as above. Currently Mod I bed mobility, supervision for toilet transfer and standing at sink for grooming. Min A for tub transfers and Min guard for LB ADL's. Pt has DME, was educated in use of 3:1 & hand held shower head PRN and use of LH reacher to assist with LB dressing. Pt reports that she will have necessary family assist at d/c and does not need continued acute OT at this time. Will sign off.    Follow Up Recommendations  No OT follow up;Supervision - Intermittent    Equipment Recommendations  None recommended by OT (Pt has necessary DME and some a/e)    Recommendations for Other Services       Precautions / Restrictions Precautions Precautions: Knee Precaution Booklet Issued: Yes (comment) Restrictions Weight Bearing Restrictions: No RLE Weight Bearing: Weight bearing as tolerated      Mobility Bed Mobility Overal bed mobility: Needs Assistance Bed Mobility: Supine to Sit     Supine to sit: Modified independent (Device/Increase time);HOB elevated     General bed mobility comments: No hands on/no physical assist.   Transfers Overall transfer level: Needs assistance Equipment used: Rolling walker (2 wheeled) Transfers: Sit to/from [facility] Sit to Stand: Supervision Stand pivot transfers: Supervision       General transfer comment: Verbal cues for hand placement prior to transfer.    Balance Overall balance assessment: Needs assistance Sitting-balance support: No upper extremity supported;Feet supported Sitting balance-Leahy Scale: Good     Standing balance support: Single extremity supported;During  functional activity Standing balance-Leahy Scale: Good                             ADL either performed or assessed with clinical judgement   ADL Overall ADL's : Needs assistance/impaired Eating/Feeding: Independent;Sitting;Bed level   Grooming: Wash/dry hands;Wash/dry face;Oral care;Brushing hair;Supervision/safety;Standing Grooming Details (indicate cue type and reason): Standing at sink for grooming today Upper Body Bathing: Set up;Sitting   Lower Body Bathing: Set up;Sit to/from stand;Min guard   Upper Body Dressing : Independent;Sitting   Lower Body Dressing: Min guard;Set up;Sit to/from stand Lower Body Dressing Details (indicate cue type and reason): Has LH reacher at home. Discussed use for ADL's Toilet Transfer: Supervision/safety;Ambulation;RW Toilet Transfer Details (indicate cue type and reason): Pt ambulated into bathroom from bed, used comfort height toilet and grab bar. Has 3:1 at home. Toileting- Clothing Manipulation and Hygiene: Independent;Sitting/lateral lean   Tub/ Shower Transfer: Tub transfer;Min guard;Cueing for safety;Cueing for sequencing;Ambulation;Rolling walker Tub/Shower Transfer Details (indicate cue type and reason): Pt participated in tub transfer in [place] today. Discussed use of HH shower head and 3:1 at home. Min guard assist for safety. Handout provided/reviewed Functional mobility during ADLs: Supervision/safety;Rolling walker;Cueing for safety (VC's to reach for armrests before sitting and keep RW in front of her when grooming standing at sink) General ADL Comments: Pt was assessed and instructed in role of OT. She then participated in ADL retraining session to include toileting, tub transfer in rehab gym and a/e for ADL's. Pt reports that she will have necessary PRN assist from her sister and family at d/c  whom will be staying with her.     Vision Baseline Vision/History: Wears glasses Wears Glasses: Reading only Patient Visual  Report: No change from baseline       Perception     Praxis      Pertinent Vitals/Pain Pain Assessment: 0-10 Pain Score: 3  Pain Location: R knee Pain Descriptors / Indicators: Sore;Operative site guarding Pain Intervention(s): Limited activity within patient's tolerance;Monitored during session;Repositioned     Hand Dominance Right   Extremity/Trunk Assessment Upper Extremity Assessment Upper Extremity Assessment: Overall WFL for tasks assessed   Lower Extremity Assessment Lower Extremity Assessment: Defer to PT evaluation   Cervical / Trunk Assessment Cervical / Trunk Assessment: Normal   Communication Communication Communication: No difficulties   Cognition Arousal/Alertness: Awake/alert Behavior During Therapy: WFL for tasks assessed/performed Overall Cognitive Status: Within Functional Limits for tasks assessed                                     General Comments       Exercises     Shoulder Instructions      Home Living Family/patient expects to be discharged to:: Private residence Living Arrangements: Other relatives (Sister to assist) Available Help at Discharge: Family;Available 24 hours/day Type of Home: House Home Access: Other (comment) (threshold through garage)     Home Layout: Two level;Able to live on main level with bedroom/bathroom     Bathroom Shower/Tub: Teacher, early years/pre: Standard     Home Equipment: Environmental consultant - 2 wheels;Bedside commode;Grab bars - tub/shower          Prior Functioning/Environment Level of Independence: Independent                 OT Problem List:        OT Treatment/Interventions:      OT Goals(Current goals can be found in the care plan section) Acute Rehab OT Goals Patient Stated Goal: Go home with PRN family assist OT Goal Formulation: All assessment and education complete, DC therapy  OT Frequency:     Barriers to D/C:            Co-evaluation               End of Session Equipment Utilized During Treatment: Gait belt;Rolling walker CPM Right Knee CPM Right Knee: On Right Knee Flexion (Degrees): 60 Right Knee Extension (Degrees): 0  Activity Tolerance: Patient tolerated treatment well;No increased pain Patient left: in chair;with call bell/phone within reach  OT Visit Diagnosis: Other abnormalities of gait and mobility (R26.89);Pain Pain - Right/Left: Right Pain - part of body: Knee                Time: 0827-0859 OT Time Calculation (min): 32 min Charges:  OT General Charges $OT Visit: 1 Procedure OT Evaluation $OT Eval Low Complexity: 1 Procedure OT Treatments $Self Care/Home Management : 8-22 mins G-Codes: OT G-codes **NOT FOR INPATIENT CLASS** Functional Assessment Tool Used: AM-PAC 6 Clicks Daily Activity;Clinical judgement Functional Limitation: Self care Self Care Current Status (O3500): At least 20 percent but less than 40 percent impaired, limited or restricted Self Care Goal Status (X3818): At least 20 percent but less than 40 percent impaired, limited or restricted Self Care Discharge Status [phone]): At least 20 percent but less than 40 percent impaired, limited or restricted   Percell Miller, OTR/L 11/07/16 9:15 AM    Lillyth Spong Ardath Sax  11/07/2016, 9:11 AM

## 2016-11-07 NOTE — Progress Notes (Signed)
SPORTS MEDICINE AND JOINT REPLACEMENT  Lara Mulch, MD    Carlyon Shadow, PA-C [place], [place],   [address]                             [phone]   PROGRESS NOTE  Subjective:  negative for Chest Pain  negative for Shortness of Breath  negative for Nausea/Vomiting   negative for Calf Pain  negative for Bowel Movement   Tolerating Diet: yes         Patient reports pain as 4 on 0-10 scale.    Objective: Vital signs in last 24 hours:   Patient Vitals for the past 24 hrs:  BP Temp Temp src Pulse Resp SpO2  11/07/16 0542 131/62 97.8 F (36.6 C) Oral 81 15 94 %  11/07/16 0038 124/67 98.1 F (36.7 C) Oral 82 15 96 %  11/06/16 2013 127/74 98.1 F (36.7 C) Oral 80 15 96 %  11/06/16 1115 - 97 F (36.1 C) - 76 15 96 %  11/06/16 1110 (!) 148/71 - - 86 16 99 %  11/06/16 1100 - - - 82 20 97 %  11/06/16 1045 - - - 80 18 97 %  11/06/16 1043 127/66 - - 84 (!) 21 97 %  11/06/16 1040 (!) 164/128 97.7 F (36.5 C) - 87 19 94 %  11/06/16 0745 (!) 173/76 - - 72 14 94 %  11/06/16 0740 (!) 169/68 - - 76 15 97 %  11/06/16 0735 (!) 186/94 - - 76 (!) 22 99 %    @flow {1959:LAST@   Intake/Output from previous day:   04/16 0701 - 04/17 0700 In: 1620 [P.O.:420; I.V.:1200] Out: 150 [Urine:100]   Intake/Output this shift:   No intake/output data recorded.   Intake/Output      04/16 0701 - 04/17 0700 04/17 0701 - 04/18 0700   P.O. 420    I.V. 1200    Total Intake 1620     Urine 100    Blood 50    Total Output 150     Net +1470          Urine Occurrence 1 x       LABORATORY DATA: No results for input(s): WBC, HGB, HCT, PLT in the last 168 hours. No results for input(s): NA, K, CL, CO2, BUN, CREATININE, GLUCOSE, CALCIUM in the last 168 hours. No results found for: INR, PROTIME  Examination:  General appearance: alert, cooperative and no distress Extremities: extremities normal, atraumatic, no cyanosis or edema  Wound Exam: clean, dry, intact   Drainage:   None: wound tissue dry  Motor Exam: Quadriceps and Hamstrings Intact  Sensory Exam: Superficial Peroneal, Deep Peroneal and Tibial normal   Assessment:    1 Day Post-Op  Procedure(s) (LRB): RIGHT TOTAL KNEE ARTHROPLASTY (Right)  ADDITIONAL DIAGNOSIS:  Active Problems:   S/P total knee replacement    Plan: Physical Therapy as ordered Weight Bearing as Tolerated (WBAT)  DVT Prophylaxis:  Aspirin  DISCHARGE PLAN: Home  DISCHARGE NEEDS: HHPT   Patient doing great. Anticipate D/C home today         Donia Ast 11/07/2016, 7:12 AM

## 2016-11-08 DIAGNOSIS — R69 Illness, unspecified: Secondary | ICD-10-CM | POA: Diagnosis not present

## 2016-11-08 DIAGNOSIS — Z7982 Long term (current) use of aspirin: Secondary | ICD-10-CM | POA: Diagnosis not present

## 2016-11-08 DIAGNOSIS — Z471 Aftercare following joint replacement surgery: Secondary | ICD-10-CM | POA: Diagnosis not present

## 2016-11-08 DIAGNOSIS — E785 Hyperlipidemia, unspecified: Secondary | ICD-10-CM | POA: Diagnosis not present

## 2016-11-08 DIAGNOSIS — Z96651 Presence of right artificial knee joint: Secondary | ICD-10-CM | POA: Diagnosis not present

## 2016-11-08 DIAGNOSIS — Z6841 Body Mass Index (BMI) 40.0 and over, adult: Secondary | ICD-10-CM | POA: Diagnosis not present

## 2016-11-10 DIAGNOSIS — Z7982 Long term (current) use of aspirin: Secondary | ICD-10-CM | POA: Diagnosis not present

## 2016-11-10 DIAGNOSIS — Z6841 Body Mass Index (BMI) 40.0 and over, adult: Secondary | ICD-10-CM | POA: Diagnosis not present

## 2016-11-10 DIAGNOSIS — E785 Hyperlipidemia, unspecified: Secondary | ICD-10-CM | POA: Diagnosis not present

## 2016-11-10 DIAGNOSIS — Z96651 Presence of right artificial knee joint: Secondary | ICD-10-CM | POA: Diagnosis not present

## 2016-11-10 DIAGNOSIS — Z471 Aftercare following joint replacement surgery: Secondary | ICD-10-CM | POA: Diagnosis not present

## 2016-11-10 DIAGNOSIS — R69 Illness, unspecified: Secondary | ICD-10-CM | POA: Diagnosis not present

## 2016-11-13 DIAGNOSIS — Z96651 Presence of right artificial knee joint: Secondary | ICD-10-CM | POA: Diagnosis not present

## 2016-11-13 DIAGNOSIS — Z471 Aftercare following joint replacement surgery: Secondary | ICD-10-CM | POA: Diagnosis not present

## 2016-11-13 DIAGNOSIS — Z6841 Body Mass Index (BMI) 40.0 and over, adult: Secondary | ICD-10-CM | POA: Diagnosis not present

## 2016-11-13 DIAGNOSIS — E785 Hyperlipidemia, unspecified: Secondary | ICD-10-CM | POA: Diagnosis not present

## 2016-11-13 DIAGNOSIS — Z7982 Long term (current) use of aspirin: Secondary | ICD-10-CM | POA: Diagnosis not present

## 2016-11-13 DIAGNOSIS — R69 Illness, unspecified: Secondary | ICD-10-CM | POA: Diagnosis not present

## 2016-11-15 DIAGNOSIS — R69 Illness, unspecified: Secondary | ICD-10-CM | POA: Diagnosis not present

## 2016-11-15 DIAGNOSIS — E785 Hyperlipidemia, unspecified: Secondary | ICD-10-CM | POA: Diagnosis not present

## 2016-11-15 DIAGNOSIS — Z7982 Long term (current) use of aspirin: Secondary | ICD-10-CM | POA: Diagnosis not present

## 2016-11-15 DIAGNOSIS — Z6841 Body Mass Index (BMI) 40.0 and over, adult: Secondary | ICD-10-CM | POA: Diagnosis not present

## 2016-11-15 DIAGNOSIS — Z471 Aftercare following joint replacement surgery: Secondary | ICD-10-CM | POA: Diagnosis not present

## 2016-11-15 DIAGNOSIS — Z96651 Presence of right artificial knee joint: Secondary | ICD-10-CM | POA: Diagnosis not present

## 2016-11-16 DIAGNOSIS — Z96651 Presence of right artificial knee joint: Secondary | ICD-10-CM | POA: Diagnosis not present

## 2016-11-16 DIAGNOSIS — Z471 Aftercare following joint replacement surgery: Secondary | ICD-10-CM | POA: Diagnosis not present

## 2016-11-17 DIAGNOSIS — M25661 Stiffness of right knee, not elsewhere classified: Secondary | ICD-10-CM | POA: Diagnosis not present

## 2016-11-22 DIAGNOSIS — M25661 Stiffness of right knee, not elsewhere classified: Secondary | ICD-10-CM | POA: Diagnosis not present

## 2016-11-24 DIAGNOSIS — M25661 Stiffness of right knee, not elsewhere classified: Secondary | ICD-10-CM | POA: Diagnosis not present

## 2016-11-28 DIAGNOSIS — M25661 Stiffness of right knee, not elsewhere classified: Secondary | ICD-10-CM | POA: Diagnosis not present

## 2016-11-30 DIAGNOSIS — M25661 Stiffness of right knee, not elsewhere classified: Secondary | ICD-10-CM | POA: Diagnosis not present

## 2016-12-04 DIAGNOSIS — M25661 Stiffness of right knee, not elsewhere classified: Secondary | ICD-10-CM | POA: Diagnosis not present

## 2016-12-05 ENCOUNTER — Encounter: Payer: Self-pay | Admitting: Physician Assistant

## 2016-12-05 DIAGNOSIS — F419 Anxiety disorder, unspecified: Secondary | ICD-10-CM

## 2016-12-07 DIAGNOSIS — M25661 Stiffness of right knee, not elsewhere classified: Secondary | ICD-10-CM | POA: Diagnosis not present

## 2016-12-12 DIAGNOSIS — M25661 Stiffness of right knee, not elsewhere classified: Secondary | ICD-10-CM | POA: Diagnosis not present

## 2016-12-12 MED ORDER — SERTRALINE HCL 25 MG PO TABS: 25.0000 mg | ORAL_TABLET | Freq: Every day | ORAL | 1 refills | Status: DC

## 2016-12-14 DIAGNOSIS — M25661 Stiffness of right knee, not elsewhere classified: Secondary | ICD-10-CM | POA: Diagnosis not present

## 2016-12-25 NOTE — Addendum Note (Signed)
Addendum  created 12/25/16 1324 by Zowie Lundahl, MD   Sign clinical note    

## 2017-01-01 ENCOUNTER — Other Ambulatory Visit: Payer: Self-pay | Admitting: Physician Assistant

## 2017-01-01 DIAGNOSIS — G4700 Insomnia, unspecified: Secondary | ICD-10-CM

## 2017-01-02 NOTE — Telephone Encounter (Signed)
Rx printed at 104. Will bring to 102 after clinic.  Meds ordered this encounter  Medications  . LORazepam (ATIVAN) 2 MG tablet    Sig: TAKE 1/2 TO 1 TABLET BY MOUTH AT BEDTIME IF NEEDED FOR SLEEP    Dispense:  30 tablet    Refill:  0

## 2017-01-04 NOTE — Telephone Encounter (Signed)
Prescription called to [facility]  Per pt's request via phone today

## 2017-01-10 DIAGNOSIS — Z1231 Encounter for screening mammogram for malignant neoplasm of breast: Secondary | ICD-10-CM | POA: Diagnosis not present

## 2017-01-10 LAB — HM MAMMOGRAPHY

## 2017-01-15 ENCOUNTER — Ambulatory Visit (INDEPENDENT_AMBULATORY_CARE_PROVIDER_SITE_OTHER): Payer: Medicare HMO | Admitting: Physician Assistant

## 2017-01-15 ENCOUNTER — Encounter: Payer: Self-pay | Admitting: Physician Assistant

## 2017-01-15 VITALS — BP 137/76 | HR 108 | Temp 97.5°F | Resp 18 | Ht 62.0 in | Wt 215.2 lb

## 2017-01-15 DIAGNOSIS — R05 Cough: Secondary | ICD-10-CM

## 2017-01-15 DIAGNOSIS — R059 Cough, unspecified: Secondary | ICD-10-CM

## 2017-01-15 MED ORDER — BENZONATATE 100 MG PO CAPS: 100.0000 mg | ORAL_CAPSULE | Freq: Three times a day (TID) | ORAL | 0 refills | Status: DC | PRN

## 2017-01-15 MED ORDER — GUAIFENESIN ER 1200 MG PO TB12: 1.0000 | ORAL_TABLET | Freq: Two times a day (BID) | ORAL | 1 refills | Status: DC | PRN

## 2017-01-15 NOTE — Patient Instructions (Addendum)
Get plenty of rest and drink at least 64 ounces of water daily.    IF you received an x-ray today, you will receive an invoice from [facility]. Please contact [facility] at [phone] with questions or concerns regarding your invoice.   IF you received labwork today, you will receive an invoice from [facility]. Please contact [facility] at [phone] with questions or concerns regarding your invoice.   Our billing staff will not be able to assist you with questions regarding bills from these companies.  You will be contacted with the lab results as soon as they are available. The fastest way to get your results is to activate your My Chart account. Instructions are located on the last page of this paperwork. If you have not heard from us regarding the results in 2 weeks, please contact this office.      

## 2017-01-15 NOTE — Progress Notes (Signed)
Patient ID: Deanna Allen, female    DOB: January 22, 1951, 66 y.o.   MRN: [id number]  PCP: Harrison Mons, PA-C  Chief Complaint  Patient presents with  . Cough    x5 days, sore throat for about 3 days per pt dull and achy currently, drainage, crusty eyes.    Subjective:   Presents for evaluation of 5-6 days of illness.  Her first symptoms were cough and sore throat. I think this is my usual allergies turning into bronchitis, but I've never had allergies this time of year. The sore throat is improved, "at the end," just dull. Really tired, not sleeping due to underlying insomnia and cough.  No fever, chills. No headache, body aches, nausea or vomiting.  Her grandson was diagnosed with strep throat yesterday, but her symptoms began before her exposure to him.    Review of Systems As above.    Patient Active Problem List   Diagnosis Date Noted  . S/P TKR (total knee replacement) using cement, right 11/06/2016  . 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 Arthritis   . Insomnia, persistent 04/25/2012  . History of anxiety disorder 04/25/2012  . Obesity, Class III, BMI 40-49.9 (morbid obesity) ([place]) 04/25/2012  . Hyperlipidemia 04/25/2012     Prior to Admission medications   Medication Sig Start Date End Date Taking? Authorizing Provider  aspirin EC 325 MG EC tablet Take 1 tablet (325 mg total) by mouth 2 (two) times daily. 11/07/16  Yes Donia Ast, PA  Biotin 5000 MCG TABS Take by mouth.   Yes [provider]  calcium carbonate (OS-CAL) 600 MG TABS Take 600 mg by mouth 2 (two) times daily with a meal.   Yes [provider]  Cholecalciferol (VITAMIN D-3) 5000 units TABS Take 5,000 mg by mouth daily.   Yes [provider]  Coenzyme Q10 (CO Q 10 PO) Take 1 capsule by mouth daily.   Yes [provider]  ezetimibe (ZETIA) 10 MG tablet Take 1 tablet (10 mg total) by mouth daily. 06/10/16  Yes Olly Shiner, PA-C  Glucosamine HCl ([facility]  GLUCOSAMINE) 1500 MG TABS Take 1,500 mg by mouth 2 (two) times daily.   Yes [provider]  Javier Docker Oil 500 MG CAPS Take 500 mg by mouth daily.   Yes [provider]  LORazepam (ATIVAN) 2 MG tablet TAKE 1/2 TO 1 TABLET BY MOUTH AT BEDTIME IF NEEDED FOR SLEEP 01/02/17  Yes Addie Alonge, PA-C  sertraline (ZOLOFT) 25 MG tablet Take 1 tablet (25 mg total) by mouth daily. 12/12/16  Yes Harrison Mons, PA-C     Allergies  Allergen Reactions  . No Known Allergies        Objective:  Physical Exam  Constitutional: She is oriented to person, place, and time. She appears well-developed and well-nourished. No distress.  BP 137/76 (BP Location: Right Arm, Patient Position: Sitting, Cuff Size: Large)   Pulse (!) 108   Temp 97.5 F (36.4 C) (Oral)   Resp 18   Ht 5\' 2"  (1.575 m)   Wt 215 lb 3.2 oz (97.6 kg)   SpO2 97%   BMI 39.36 kg/m    HENT:  Head: Normocephalic and atraumatic.  Right Ear: Hearing, tympanic membrane, external ear and ear canal normal.  Left Ear: Hearing, tympanic membrane, external ear and ear canal normal.  Nose: Mucosal edema and rhinorrhea present.  No foreign bodies. Right sinus exhibits no maxillary sinus tenderness and no frontal sinus tenderness. Left sinus exhibits no  maxillary sinus tenderness and no frontal sinus tenderness.  Mouth/Throat: Uvula is midline, oropharynx is clear and moist and mucous membranes are normal. No uvula swelling. No oropharyngeal exudate.  Eyes: Conjunctivae and EOM are normal. Pupils are equal, round, and reactive to light. Right eye exhibits no discharge. Left eye exhibits no discharge. No scleral icterus.  Neck: Trachea normal, normal range of motion and full passive range of motion without pain. Neck supple. No thyroid mass and no thyromegaly present.  Cardiovascular: Normal rate, regular rhythm and normal heart sounds.   Pulmonary/Chest: Effort normal and breath sounds normal.  Lymphadenopathy:       Head (right  side): No submandibular, no tonsillar, no preauricular, no posterior auricular and no occipital adenopathy present.       Head (left side): No submandibular, no tonsillar, no preauricular and no occipital adenopathy present.    She has no cervical adenopathy.       Right: No supraclavicular adenopathy present.       Left: No supraclavicular adenopathy present.  Neurological: She is alert and oriented to person, place, and time. She has normal strength. No cranial nerve deficit or sensory deficit.  Skin: Skin is warm, dry and intact. No rash noted.  Psychiatric: She has a normal mood and affect. Her speech is normal and behavior is normal.           Assessment & Plan:   1. Cough Likely resolving viral URI. Supportive care.  Anticipatory guidance.  RTC if symptoms worsen/persist. - Guaifenesin (MUCINEX MAXIMUM STRENGTH) 1200 MG TB12; Take 1 tablet (1,200 mg total) by mouth every 12 (twelve) hours as needed.  Dispense: 14 tablet; Refill: 1 - benzonatate (TESSALON) 100 MG capsule; Take 1-2 capsules (100-200 mg total) by mouth 3 (three) times daily as needed for cough.  Dispense: 40 capsule; Refill: 0    Return if symptoms worsen or fail to improve.   Fara Chute, PA-C [facility]

## 2017-01-26 DIAGNOSIS — Z0100 Encounter for examination of eyes and vision without abnormal findings: Secondary | ICD-10-CM | POA: Diagnosis not present

## 2017-01-26 DIAGNOSIS — H52223 Regular astigmatism, bilateral: Secondary | ICD-10-CM | POA: Diagnosis not present

## 2017-02-15 ENCOUNTER — Ambulatory Visit (INDEPENDENT_AMBULATORY_CARE_PROVIDER_SITE_OTHER): Payer: Medicare HMO | Admitting: Physician Assistant

## 2017-02-15 ENCOUNTER — Encounter: Payer: Self-pay | Admitting: Physician Assistant

## 2017-02-15 VITALS — BP 130/74 | HR 84 | Temp 98.2°F | Resp 18 | Ht 62.0 in | Wt 219.8 lb

## 2017-02-15 DIAGNOSIS — Z471 Aftercare following joint replacement surgery: Secondary | ICD-10-CM | POA: Diagnosis not present

## 2017-02-15 DIAGNOSIS — H6123 Impacted cerumen, bilateral: Secondary | ICD-10-CM | POA: Diagnosis not present

## 2017-02-15 DIAGNOSIS — Z96651 Presence of right artificial knee joint: Secondary | ICD-10-CM | POA: Diagnosis not present

## 2017-02-15 NOTE — Patient Instructions (Signed)
     IF you received an x-ray today, you will receive an invoice from [facility]. Please contact [facility] at [phone] with questions or concerns regarding your invoice.   IF you received labwork today, you will receive an invoice from [facility]. Please contact [facility] at [phone] with questions or concerns regarding your invoice.   Our billing staff will not be able to assist you with questions regarding bills from these companies.  You will be contacted with the lab results as soon as they are available. The fastest way to get your results is to activate your My Chart account. Instructions are located on the last page of this paperwork. If you have not heard from us regarding the results in 2 weeks, please contact this office.     

## 2017-02-16 NOTE — Progress Notes (Signed)
PRIMARY CARE AT [facility] [address] [place] [address] [phone]  Date:  02/15/2017   Name:  Deanna Allen   DOB:  01-27-1951   MRN:  [id number]  PCP:  Harrison Mons, PA-C    History of Present Illness:  Deanna Allen is a 66 y.o. female patient who presents to PCP with  Chief Complaint  Patient presents with  . Cerumen Impaction    both ears left is worse      Patient notes that he has difficulty hearing secondary to feeling like her ears are clogged.  She has periodically had these cleansed and irrigated. No congestion, ear pain, or cough.  No upper respiratory sxs.    Patient Active Problem List   Diagnosis Date Noted  . S/P TKR (total knee replacement) using cement, right 11/06/2016  . 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 Arthritis   . Insomnia, persistent 04/25/2012  . History of anxiety disorder 04/25/2012  . Obesity, Class III, BMI 40-49.9 (morbid obesity) ([place]) 04/25/2012  . Hyperlipidemia 04/25/2012    Past Medical History:  Diagnosis Date  . Anxiety   . Arthritis    RIGHT knee, bone on bone  . Cancer (HCC)    basal cell CA-face  . Family history of adverse reaction to anesthesia    sister n&v  . Hepatitis    as a baby  . Hyperlipidemia   . Left ankle injury 12/11/2012    Past Surgical History:  Procedure Laterality Date  . APPENDECTOMY    . BREAST LUMPECTOMY WITH RADIOACTIVE SEED LOCALIZATION Right 01/11/2016   Procedure: RIGHT BREAST LUMPECTOMY WITH RADIOACTIVE SEED LOCALIZATION;  Surgeon: Coralie Keens, MD;  Location: [facility];  Service: General;  Laterality: Right;  RIGHT BREAST LUMPECTOMY WITH RADIOACTIVE SEED LOCALIZATION  . COLONOSCOPY    . GANGLION CYST EXCISION Right    wrist  . KNEE SURGERY  2001   right arthroscopy  . TOTAL KNEE ARTHROPLASTY Right 11/06/2016  . TOTAL KNEE ARTHROPLASTY Right 11/06/2016   Procedure: RIGHT TOTAL KNEE ARTHROPLASTY;  Surgeon: Vickey Huger, MD;  Location: [facility];  Service:  Orthopedics;  Laterality: Right;    Social History  Substance Use Topics  . Smoking status: Never Smoker  . Smokeless tobacco: Never Used  . Alcohol use Yes     Comment: 2-3 a month    Family History  Problem Relation Age of Onset  . Colon cancer Father 44  . Leukemia Father   . Heart attack Father   . Hyperlipidemia Father   . Hypertension Father   . Colon cancer Maternal Grandmother        DX  AGE 18'S  . Alcohol abuse Maternal Grandmother   . Muscular dystrophy Mother 77  . Alcohol abuse Maternal Grandfather   . Emphysema Maternal Grandfather   . Asthma Son   . Esophageal cancer Neg Hx   . Stomach cancer Neg Hx   . Rectal cancer Neg Hx   . Diabetes Neg Hx   . Sudden death Neg Hx     Allergies  Allergen Reactions  . No Known Allergies     Medication list has been reviewed and updated.  Current Outpatient Prescriptions on File Prior to Visit  Medication Sig Dispense Refill  . aspirin EC 325 MG EC tablet Take 1 tablet (325 mg total) by mouth 2 (two) times daily. 30 tablet 0  . benzonatate (TESSALON) 100 MG capsule Take 1-2 capsules (100-200 mg total) by mouth 3 (three) times  daily as needed for cough. 40 capsule 0  . Biotin 5000 MCG TABS Take by mouth.    . calcium carbonate (OS-CAL) 600 MG TABS Take 600 mg by mouth 2 (two) times daily with a meal.    . Cholecalciferol (VITAMIN D-3) 5000 units TABS Take 5,000 mg by mouth daily.    . Coenzyme Q10 (CO Q 10 PO) Take 1 capsule by mouth daily.    Marland Kitchen ezetimibe (ZETIA) 10 MG tablet Take 1 tablet (10 mg total) by mouth daily. 90 tablet 3  . Glucosamine HCl ([facility] GLUCOSAMINE) 1500 MG TABS Take 1,500 mg by mouth 2 (two) times daily.    . Guaifenesin (MUCINEX MAXIMUM STRENGTH) 1200 MG TB12 Take 1 tablet (1,200 mg total) by mouth every 12 (twelve) hours as needed. 14 tablet 1  . Krill Oil 500 MG CAPS Take 500 mg by mouth daily.    Marland Kitchen LORazepam (ATIVAN) 2 MG tablet TAKE 1/2 TO 1 TABLET BY MOUTH AT BEDTIME IF NEEDED FOR SLEEP 30  tablet 0  . Multiple Vitamin (MULTIVITAMIN WITH MINERALS) TABS tablet Take 1 tablet by mouth daily.    . sertraline (ZOLOFT) 25 MG tablet Take 1 tablet (25 mg total) by mouth daily. 90 tablet 1   No current facility-administered medications on file prior to visit.     ROS ROS otherwise unremarkable unless listed above.  Physical Examination: BP 130/74   Pulse 84   Temp 98.2 F (36.8 C) (Oral)   Resp 18   Ht 5\' 2"  (1.575 m)   Wt 219 lb 12.8 oz (99.7 kg)   SpO2 96%   BMI 40.20 kg/m  Ideal Body Weight: Weight in (lb) to have BMI = 25: 136.4  Physical Exam  Constitutional: She is oriented to person, place, and time. She appears well-developed and well-nourished. No distress.  HENT:  Head: Normocephalic and atraumatic.  Right Ear: External ear normal.  Left Ear: External ear normal.  Tm normal once irrigated.  Not visualized due to cerumen impaction  Eyes: Pupils are equal, round, and reactive to light. Conjunctivae and EOM are normal.  Cardiovascular: Normal rate.   Pulmonary/Chest: Effort normal. No respiratory distress.  Neurological: She is alert and oriented to person, place, and time.  Skin: She is not diaphoretic.  Psychiatric: She has a normal mood and affect. Her behavior is normal.     Assessment and Plan: Deanna Allen is a 66 y.o. female who is here today for cc of ears clogged.   Bilateral impacted cerumen  Ivar Drape, PA-C [facility] 7/27/201810:49 AM

## 2017-02-22 ENCOUNTER — Other Ambulatory Visit: Payer: Self-pay | Admitting: Physician Assistant

## 2017-02-22 ENCOUNTER — Telehealth: Payer: Self-pay | Admitting: Family Medicine

## 2017-02-22 DIAGNOSIS — G4700 Insomnia, unspecified: Secondary | ICD-10-CM

## 2017-02-22 NOTE — Telephone Encounter (Signed)
ATIVAN Rx refused. Pt needs OV.

## 2017-02-22 NOTE — Telephone Encounter (Signed)
LMOM FOR PT TO CALL AN SCHEDULE AN OV FOR MED REFILL

## 2017-02-26 ENCOUNTER — Ambulatory Visit: Payer: Medicare HMO | Admitting: Physician Assistant

## 2017-03-13 ENCOUNTER — Ambulatory Visit (INDEPENDENT_AMBULATORY_CARE_PROVIDER_SITE_OTHER): Payer: Medicare HMO | Admitting: Physician Assistant

## 2017-03-13 ENCOUNTER — Encounter: Payer: Self-pay | Admitting: Physician Assistant

## 2017-03-13 VITALS — BP 134/75 | HR 85 | Temp 97.9°F | Resp 18 | Ht 62.0 in | Wt 216.8 lb

## 2017-03-13 DIAGNOSIS — G4700 Insomnia, unspecified: Secondary | ICD-10-CM | POA: Diagnosis not present

## 2017-03-13 DIAGNOSIS — F419 Anxiety disorder, unspecified: Secondary | ICD-10-CM | POA: Diagnosis not present

## 2017-03-13 DIAGNOSIS — E2839 Other primary ovarian failure: Secondary | ICD-10-CM

## 2017-03-13 DIAGNOSIS — R69 Illness, unspecified: Secondary | ICD-10-CM | POA: Diagnosis not present

## 2017-03-13 MED ORDER — LORAZEPAM 2 MG PO TABS: ORAL_TABLET | ORAL | 0 refills | Status: DC

## 2017-03-13 MED ORDER — SERTRALINE HCL 25 MG PO TABS: 25.0000 mg | ORAL_TABLET | Freq: Every day | ORAL | 3 refills | Status: DC

## 2017-03-13 NOTE — Progress Notes (Signed)
Patient ID: Deanna Allen, female    DOB: Aug 23, 1950, 66 y.o.   MRN: [id number]  PCP: Harrison Mons, PA-C  Chief Complaint  Patient presents with  . Medication Refill    Lorazepam 2 MG, Sertraline HCI 25 MG    Subjective:   Presents for medication refills.  Knee replacement went well and she has recovered really well. The initial pain was considerable, but she really worked hard in rehab and is nearly back to her normal.   Ran out of lorazepam, and tried to go without it, but can't sleep. When she did sleep, she had terrible nightmares. Sometimes she can get by with 1/2 tablet. This is a long-standing, life-long problem. Periodic exacerbations associated with life stressors. "Wanders" with OTC sleep aids. Not interested in increasing the sertraline due to associated weight gain. The low dose of sertraline seems to help reduce the exacerbations of her anxiety, and help prevent severe episodes. Doesn't watch the news, "the way the world is right now really upsets me."  Taking Zetia daily. When it was checked in 09/2016, she'd been off it x 4 weeks, and was not fasting. Has a wellness visit scheduled in 04/2017, agrees to fast for that.  Review of Systems As above. No chest pain, SOB, HA, dizziness, vision change, N/V, diarrhea, constipation, dysuria, urinary urgency or frequency, myalgias, other arthralgias or rash.     Patient Active Problem List   Diagnosis Date Noted  . S/P TKR (total knee replacement) using cement, right 11/06/2016  . 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 Arthritis   . Insomnia, persistent 04/25/2012  . History of anxiety disorder 04/25/2012  . Obesity, Class III, BMI 40-49.9 (morbid obesity) ([place]) 04/25/2012  . Hyperlipidemia 04/25/2012     Prior to Admission medications   Medication Sig Start Date End Date Taking? Authorizing Provider  Biotin 5000 MCG TABS Take by mouth.   Yes [provider]  calcium carbonate (OS-CAL) 600 MG TABS  Take 600 mg by mouth 2 (two) times daily with a meal.   Yes [provider]  Cholecalciferol (VITAMIN D-3) 5000 units TABS Take 5,000 mg by mouth daily.   Yes [provider]  Coenzyme Q10 (CO Q 10 PO) Take 1 capsule by mouth daily.   Yes [provider]  ezetimibe (ZETIA) 10 MG tablet Take 1 tablet (10 mg total) by mouth daily. 06/10/16  Yes Jermany Rimel, PA-C  Glucosamine HCl ([employer] GLUCOSAMINE) 1500 MG TABS Take 1,500 mg by mouth 2 (two) times daily.   Yes [provider]  Javier Docker Oil 500 MG CAPS Take 500 mg by mouth daily.   Yes [provider]  LORazepam (ATIVAN) 2 MG tablet TAKE 1/2 TO 1 TABLET BY MOUTH AT BEDTIME IF NEEDED FOR SLEEP 01/02/17  Yes Ashar Lewinski, PA-C  Multiple Vitamin (MULTIVITAMIN WITH MINERALS) TABS tablet Take 1 tablet by mouth daily.   Yes [provider]  sertraline (ZOLOFT) 25 MG tablet Take 1 tablet (25 mg total) by mouth daily. 12/12/16  Yes Raveena Hebdon, PA-C  aspirin 81 MG tablet Take 1 tablet (81 mg total) by mouth 1 (one) time daily. (prescribed 325 mg BID)  11/07/16        Allergies  Allergen Reactions  . No Known Allergies        Objective:  Physical Exam  Constitutional: She is oriented to person, place, and time. She appears well-developed and well-nourished. She is active and cooperative. No distress.  BP 134/75 (BP Location:  Right Arm, Patient Position: Sitting, Cuff Size: Large)   Pulse 85   Temp 97.9 F (36.6 C) (Oral)   Resp 18   Ht 5\' 2"  (1.575 m)   Wt 216 lb 12.8 oz (98.3 kg)   SpO2 98%   BMI 39.65 kg/m   HENT:  Head: Normocephalic and atraumatic.  Right Ear: Hearing normal.  Left Ear: Hearing normal.  Eyes: Conjunctivae are normal. No scleral icterus.  Neck: Normal range of motion. Neck supple. No thyromegaly present.  Cardiovascular: Normal rate, regular rhythm and normal heart sounds.   Pulses:      Radial pulses are 2+ on the right side, and 2+ on the left side.    Pulmonary/Chest: Effort normal and breath sounds normal.  Lymphadenopathy:       Head (right side): No tonsillar, no preauricular, no posterior auricular and no occipital adenopathy present.       Head (left side): No tonsillar, no preauricular, no posterior auricular and no occipital adenopathy present.    She has no cervical adenopathy.       Right: No supraclavicular adenopathy present.       Left: No supraclavicular adenopathy present.  Neurological: She is alert and oriented to person, place, and time. No sensory deficit.  Skin: Skin is warm, dry and intact. No rash noted. No cyanosis or erythema. Nails show no clubbing.  Psychiatric: She has a normal mood and affect. Her speech is normal and behavior is normal.       Assessment & Plan:   Problem List Items Addressed This Visit    Insomnia, persistent - Primary    Unchanged. Continue current treatment. Again declines increased sertraline dose or addition of another medication.      Relevant Medications   LORazepam (ATIVAN) 2 MG tablet   Anxiety disorder    Stable. Not interested in increased sertraline dose or additional agent.      Relevant Medications   sertraline (ZOLOFT) 25 MG tablet   LORazepam (ATIVAN) 2 MG tablet    Other Visit Diagnoses    Estrogen deficiency       Relevant Orders   DG Bone Density       Return for Wellness visit and fasting labs in October, as planned.   Fara Chute, PA-C [facility]

## 2017-03-13 NOTE — Patient Instructions (Signed)
     IF you received an x-ray today, you will receive an invoice from [facility]. Please contact [facility] at [phone] with questions or concerns regarding your invoice.   IF you received labwork today, you will receive an invoice from [facility]. Please contact [facility] at [phone] with questions or concerns regarding your invoice.   Our billing staff will not be able to assist you with questions regarding bills from these companies.  You will be contacted with the lab results as soon as they are available. The fastest way to get your results is to activate your My Chart account. Instructions are located on the last page of this paperwork. If you have not heard from us regarding the results in 2 weeks, please contact this office.     

## 2017-03-17 NOTE — Assessment & Plan Note (Signed)
Unchanged. Continue current treatment. Again declines increased sertraline dose or addition of another medication.

## 2017-03-17 NOTE — Assessment & Plan Note (Signed)
Stable. Not interested in increased sertraline dose or additional agent.

## 2017-04-10 DIAGNOSIS — D1801 Hemangioma of skin and subcutaneous tissue: Secondary | ICD-10-CM | POA: Diagnosis not present

## 2017-04-10 DIAGNOSIS — L821 Other seborrheic keratosis: Secondary | ICD-10-CM | POA: Diagnosis not present

## 2017-04-10 DIAGNOSIS — L918 Other hypertrophic disorders of the skin: Secondary | ICD-10-CM | POA: Diagnosis not present

## 2017-04-10 DIAGNOSIS — L570 Actinic keratosis: Secondary | ICD-10-CM | POA: Diagnosis not present

## 2017-04-10 DIAGNOSIS — Z419 Encounter for procedure for purposes other than remedying health state, unspecified: Secondary | ICD-10-CM | POA: Diagnosis not present

## 2017-04-10 DIAGNOSIS — D225 Melanocytic nevi of trunk: Secondary | ICD-10-CM | POA: Diagnosis not present

## 2017-04-10 DIAGNOSIS — D2261 Melanocytic nevi of right upper limb, including shoulder: Secondary | ICD-10-CM | POA: Diagnosis not present

## 2017-04-10 DIAGNOSIS — L814 Other melanin hyperpigmentation: Secondary | ICD-10-CM | POA: Diagnosis not present

## 2017-04-10 DIAGNOSIS — Z85828 Personal history of other malignant neoplasm of skin: Secondary | ICD-10-CM | POA: Diagnosis not present

## 2017-04-18 ENCOUNTER — Telehealth: Payer: Self-pay

## 2017-04-18 NOTE — Telephone Encounter (Signed)
Called pt to schedule Medicare Annual Wellness Visit. -nr    Josepha Pigg, B.A.  Care Guide [phone]

## 2017-04-24 ENCOUNTER — Other Ambulatory Visit: Payer: Self-pay | Admitting: Family Medicine

## 2017-04-24 DIAGNOSIS — F419 Anxiety disorder, unspecified: Secondary | ICD-10-CM

## 2017-04-24 DIAGNOSIS — G4700 Insomnia, unspecified: Secondary | ICD-10-CM

## 2017-04-24 NOTE — Telephone Encounter (Signed)
Please advise 

## 2017-04-26 DIAGNOSIS — R69 Illness, unspecified: Secondary | ICD-10-CM | POA: Diagnosis not present

## 2017-04-27 ENCOUNTER — Ambulatory Visit (INDEPENDENT_AMBULATORY_CARE_PROVIDER_SITE_OTHER): Payer: Medicare HMO | Admitting: Urgent Care

## 2017-04-27 ENCOUNTER — Encounter: Payer: Self-pay | Admitting: Urgent Care

## 2017-04-27 VITALS — BP 140/78 | HR 93 | Temp 98.5°F | Resp 18 | Ht 62.0 in | Wt 220.6 lb

## 2017-04-27 DIAGNOSIS — H9192 Unspecified hearing loss, left ear: Secondary | ICD-10-CM

## 2017-04-27 DIAGNOSIS — H6123 Impacted cerumen, bilateral: Secondary | ICD-10-CM | POA: Diagnosis not present

## 2017-04-27 NOTE — Progress Notes (Signed)
  MRN: [id number] DOB: 1950/10/19  Subjective:   Deanna Allen is a 66 y.o. female presenting for chief complaint of Cerumen Impaction (both ears worse on left)  Reports recurrent ear wax build up. Has decreased hearing of her left ear. Denies fever, pain, ear drainage. Has had to have ears irrigated before due to ear wax build up.   Deanna Allen has a current medication list which includes the following prescription(s): aspirin, biotin, calcium carbonate, vitamin d-3, coenzyme q10, ezetimibe, glucosamine hcl, krill oil, lorazepam, multivitamin with minerals, and sertraline. Also is allergic to no known allergies.  Deanna Allen  has a past medical history of Anxiety; Arthritis; Cancer ([place]); Family history of adverse reaction to anesthesia; Hepatitis; Hyperlipidemia; and Left ankle injury (12/11/2012). Also  has a past surgical history that includes Colonoscopy; Knee surgery (2001); Appendectomy; Ganglion cyst excision (Right); Breast lumpectomy with radioactive seed localization (Right, 01/11/2016); Total knee arthroplasty (Right, 11/06/2016); and Total knee arthroplasty (Right, 11/06/2016).  Objective:   Vitals: BP 140/78   Pulse 93   Temp 98.5 F (36.9 C) (Oral)   Resp 18   Ht 5\' 2"  (1.575 m)   Wt 220 lb 9.6 oz (100.1 kg)   SpO2 95%   BMI 40.35 kg/m   Physical Exam  Constitutional: She is oriented to person, place, and time. She appears well-developed and well-nourished.  HENT:  Left TM cerumen occluded. Right TM visible but also has cerumen around perimeter of ear canal.  Cardiovascular: Normal rate.   Pulmonary/Chest: Effort normal.  Neurological: She is alert and oriented to person, place, and time.   Assessment and Plan :   1. Bilateral impacted cerumen 2. Decreased hearing of left ear - Ear lavage performed today. Return-to-clinic precautions discussed, patient verbalized understanding.   Jaynee Eagles, PA-C [facility] [phone] 04/27/2017   5:14 PM

## 2017-04-27 NOTE — Patient Instructions (Addendum)
Earwax Buildup, Adult The ears produce a substance called earwax that helps keep bacteria out of the ear and protects the skin in the ear canal. Occasionally, earwax can build up in the ear and cause discomfort or hearing loss. What increases the risk? This condition is more likely to develop in people who:  Are female.  Are elderly.  Naturally produce more earwax.  Clean their ears often with cotton swabs.  Use earplugs often.  Use in-ear headphones often.  Wear hearing aids.  Have narrow ear canals.  Have earwax that is overly thick or sticky.  Have eczema.  Are dehydrated.  Have excess hair in the ear canal.  What are the signs or symptoms? Symptoms of this condition include:  Reduced or muffled hearing.  A feeling of fullness in the ear or feeling that the ear is plugged.  Fluid coming from the ear.  Ear pain.  Ear itch.  Ringing in the ear.  Coughing.  An obvious piece of earwax that can be seen inside the ear canal.  How is this diagnosed? This condition may be diagnosed based on:  Your symptoms.  Your medical history.  An ear exam. During the exam, your health care provider will look into your ear with an instrument called an otoscope.  You may have tests, including a hearing test. How is this treated? This condition may be treated by:  Using ear drops to soften the earwax.  Having the earwax removed by a health care provider. The health care provider may: ? Flush the ear with water. ? Use an instrument that has a loop on the end (curette). ? Use a suction device.  Surgery to remove the wax buildup. This may be done in severe cases.  Follow these instructions at home:  Take over-the-counter and prescription medicines only as told by your health care provider.  Do not put any objects, including cotton swabs, into your ear. You can clean the opening of your ear canal with a washcloth or facial tissue.  Follow instructions from your health  care provider about cleaning your ears. Do not over-clean your ears.  Drink enough fluid to keep your urine clear or pale yellow. This will help to thin the earwax.  Keep all follow-up visits as told by your health care provider. If earwax builds up in your ears often or if you use hearing aids, consider seeing your health care provider for routine, preventive ear cleanings. Ask your health care provider how often you should schedule your cleanings.  If you have hearing aids, clean them according to instructions from the manufacturer and your health care provider. Contact a health care provider if:  You have ear pain.  You develop a fever.  You have blood, pus, or other fluid coming from your ear.  You have hearing loss.  You have ringing in your ears that does not go away.  Your symptoms do not improve with treatment.  You feel like the room is spinning (vertigo). Summary  Earwax can build up in the ear and cause discomfort or hearing loss.  The most common symptoms of this condition include reduced or muffled hearing and a feeling of fullness in the ear or feeling that the ear is plugged.  This condition may be diagnosed based on your symptoms, your medical history, and an ear exam.  This condition may be treated by using ear drops to soften the earwax or by having the earwax removed by a health care provider.  Do   Do not put any objects, including cotton swabs, into your ear. You can clean the opening of your ear canal with a washcloth or facial tissue. This information is not intended to replace advice given to you by your health care provider. Make sure you discuss any questions you have with your health care provider. Document Released: 08/17/2004 Document Revised: 09/20/2016 Document Reviewed: 09/20/2016 Elsevier Interactive Patient Education  2018 [employer]   IF you received an x-ray today, you will receive an invoice from [facility]. Please contact   Radiology at [phone] with questions or concerns regarding your invoice.   IF you received labwork today, you will receive an invoice from [facility]. Please contact [facility] at [phone] with questions or concerns regarding your invoice.   Our billing staff will not be able to assist you with questions regarding bills from these companies.  You will be contacted with the lab results as soon as they are available. The fastest way to get your results is to activate your My Chart account. Instructions are located on the last page of this paperwork. If you have not heard from us regarding the results in 2 weeks, please contact this office.     

## 2017-04-27 NOTE — Telephone Encounter (Signed)
Meds ordered this encounter  Medications  . LORazepam (ATIVAN) 2 MG tablet    Sig: TAKE 1/2 TO 1 TABLET BY MOUTH AT BEDTIME IF NEEDED FOR SLEEP    Dispense:  30 tablet    Refill:  0

## 2017-05-01 NOTE — Telephone Encounter (Signed)
Spoke with pharmacy, not received. Gave verbal order for lorazepam as written 04/27/2017.

## 2017-06-18 ENCOUNTER — Other Ambulatory Visit: Payer: Self-pay | Admitting: Physician Assistant

## 2017-06-18 DIAGNOSIS — G4700 Insomnia, unspecified: Secondary | ICD-10-CM

## 2017-06-18 DIAGNOSIS — F419 Anxiety disorder, unspecified: Secondary | ICD-10-CM

## 2017-06-19 NOTE — Telephone Encounter (Signed)
Controlled substance 

## 2017-06-20 NOTE — Telephone Encounter (Signed)
Patient notified via My Chart. Rx sent electronically.  Meds ordered this encounter  Medications  . LORazepam (ATIVAN) 2 MG tablet    Sig: take 1/2 to 1 tablet by mouth at bedtime if needed for insomnia    Dispense:  30 tablet    Refill:  0

## 2017-07-26 ENCOUNTER — Other Ambulatory Visit: Payer: Self-pay | Admitting: Physician Assistant

## 2017-07-26 NOTE — Telephone Encounter (Signed)
Pt was suppose to follow up with Chelle in Oct 2018 for CPE and fasting labs. She needs OV for further refills.

## 2017-07-27 NOTE — Telephone Encounter (Signed)
Pt was told of her refill and I made her appt for 08/01/17 with Chelle for a CPE

## 2017-08-01 ENCOUNTER — Other Ambulatory Visit: Payer: Self-pay

## 2017-08-01 ENCOUNTER — Encounter: Payer: Self-pay | Admitting: Physician Assistant

## 2017-08-01 ENCOUNTER — Ambulatory Visit (INDEPENDENT_AMBULATORY_CARE_PROVIDER_SITE_OTHER): Payer: Medicare HMO | Admitting: Physician Assistant

## 2017-08-01 VITALS — BP 130/80 | HR 78 | Temp 97.9°F | Resp 18 | Ht 62.0 in | Wt 216.6 lb

## 2017-08-01 DIAGNOSIS — R05 Cough: Secondary | ICD-10-CM

## 2017-08-01 DIAGNOSIS — R059 Cough, unspecified: Secondary | ICD-10-CM

## 2017-08-01 DIAGNOSIS — F419 Anxiety disorder, unspecified: Secondary | ICD-10-CM

## 2017-08-01 DIAGNOSIS — E2839 Other primary ovarian failure: Secondary | ICD-10-CM | POA: Diagnosis not present

## 2017-08-01 DIAGNOSIS — R739 Hyperglycemia, unspecified: Secondary | ICD-10-CM

## 2017-08-01 DIAGNOSIS — E785 Hyperlipidemia, unspecified: Secondary | ICD-10-CM

## 2017-08-01 DIAGNOSIS — Z0000 Encounter for general adult medical examination without abnormal findings: Secondary | ICD-10-CM | POA: Diagnosis not present

## 2017-08-01 DIAGNOSIS — R69 Illness, unspecified: Secondary | ICD-10-CM | POA: Diagnosis not present

## 2017-08-01 DIAGNOSIS — G4700 Insomnia, unspecified: Secondary | ICD-10-CM | POA: Diagnosis not present

## 2017-08-01 MED ORDER — LORAZEPAM 2 MG PO TABS: ORAL_TABLET | ORAL | 0 refills | Status: DC

## 2017-08-01 MED ORDER — EZETIMIBE 10 MG PO TABS: 10.0000 mg | ORAL_TABLET | Freq: Every day | ORAL | 1 refills | Status: DC

## 2017-08-01 NOTE — Patient Instructions (Addendum)
Talk to your pharmacist about getting the Shingrix (new shingles vaccine) series.  Call to schedule your bone density test: [facility] [phone]    IF you received an x-ray today, you will receive an invoice from [facility]. Please contact [facility] at [phone] with questions or concerns regarding your invoice.   IF you received labwork today, you will receive an invoice from [facility]. Please contact [facility] at [phone] with questions or concerns regarding your invoice.   Our billing staff will not be able to assist you with questions regarding bills from these companies.  You will be contacted with the lab results as soon as they are available. The fastest way to get your results is to activate your My Chart account. Instructions are located on the last page of this paperwork. If you have not heard from [place] regarding the results in 2 weeks, please contact this office.     Preventive Care 70 Years and Older, Female Preventive care refers to lifestyle choices and visits with your health care provider that can promote health and wellness. What does preventive care include?  A yearly physical exam. This is also called an annual well check.  Dental exams once or twice a year.  Routine eye exams. Ask your health care provider how often you should have your eyes checked.  Personal lifestyle choices, including: ? Daily care of your teeth and gums. ? Regular physical activity. ? Eating a healthy diet. ? Avoiding tobacco and drug use. ? Limiting alcohol use. ? Practicing safe sex. ? Taking low-dose aspirin every day. ? Taking vitamin and mineral supplements as recommended by your health care provider. What happens during an annual well check? The services and screenings done by your health care provider during your annual well check will depend on your age, overall health, lifestyle risk factors, and family history of disease. Counseling Your  health care provider may ask you questions about your:  Alcohol use.  Tobacco use.  Drug use.  Emotional well-being.  Home and relationship well-being.  Sexual activity.  Eating habits.  History of falls.  Memory and ability to understand (cognition).  Work and work Statistician.  Reproductive health.  Screening You may have the following tests or measurements:  Height, weight, and BMI.  Blood pressure.  Lipid and cholesterol levels. These may be checked every 5 years, or more frequently if you are over 41 years old.  Skin check.  Lung cancer screening. You may have this screening every year starting at age 9 if you have a 30-pack-year history of smoking and currently smoke or have quit within the past 15 years.  Fecal occult blood test (FOBT) of the stool. You may have this test every year starting at age 86.  Flexible sigmoidoscopy or colonoscopy. You may have a sigmoidoscopy every 5 years or a colonoscopy every 10 years starting at age 19.  Hepatitis C blood test.  Hepatitis B blood test.  Sexually transmitted disease (STD) testing.  Diabetes screening. This is done by checking your blood sugar (glucose) after you have not eaten for a while (fasting). You may have this done every 1-3 years.  Bone density scan. This is done to screen for osteoporosis. You may have this done starting at age 35.  Mammogram. This may be done every 1-2 years. Talk to your health care provider about how often you should have regular mammograms.  Talk with your health care provider about your test results, treatment options, and if necessary, the need for  more tests. Vaccines Your health care provider may recommend certain vaccines, such as:  Influenza vaccine. This is recommended every year.  Tetanus, diphtheria, and acellular pertussis (Tdap, Td) vaccine. You may need a Td booster every 10 years.  Varicella vaccine. You may need this if you have not been  vaccinated.  Zoster vaccine. You may need this after age 65.  Measles, mumps, and rubella (MMR) vaccine. You may need at least one dose of MMR if you were born in 1957 or later. You may also need a second dose.  Pneumococcal 13-valent conjugate (PCV13) vaccine. One dose is recommended after age 54.  Pneumococcal polysaccharide (PPSV23) vaccine. One dose is recommended after age 36.  Meningococcal vaccine. You may need this if you have certain conditions.  Hepatitis A vaccine. You may need this if you have certain conditions or if you travel or work in places where you may be exposed to hepatitis A.  Hepatitis B vaccine. You may need this if you have certain conditions or if you travel or work in places where you may be exposed to hepatitis B.  Haemophilus influenzae type b (Hib) vaccine. You may need this if you have certain conditions.  Talk to your health care provider about which screenings and vaccines you need and how often you need them. This information is not intended to replace advice given to you by your health care provider. Make sure you discuss any questions you have with your health care provider. Document Released: 08/06/2015 Document Revised: 03/29/2016 Document Reviewed: 05/11/2015 Elsevier Interactive Patient Education  [employer].

## 2017-08-01 NOTE — Progress Notes (Signed)
Patient ID: Deanna Allen, female    DOB: Jan 12, 1951, 67 y.o.   MRN: [id number]  PCP: Harrison Mons, PA-C  Chief Complaint  Patient presents with  . Annual Exam    No Pap needed.    Subjective:   Presents for [employer].  Her daughter-in-law's brother, a physician, died of influenza in 05-28-2023. Her mother-in-law died in 2023/06/28. Family members had influenza A over the holidays. Last week, her husband fell and broke a rib. He has Parkinson's Disease. Recovering from self-diagnosed bronchitis.   Her husband's health is declining, and he will be moving back to their home in [place] with his 80 lb [place]. She is feeling angry, having mourned his move and gotten used to living alone. In addition to that, she is angry that he is moving back because now he needs her to help take care of him.   Cervical Cancer Screening: no longer a candidate Breast Cancer Screening: 01/11/2017, normal Colorectal Cancer Screening: overdue. NOT done in 2017, but previous record SCANNED in 2017. Bone Density Testing: ordered, not completed HIV Screening: complete, very low risk STI Screening: very low risk Seasonal Influenza Vaccination: current 04/27/17 Td/Tdap Vaccination: current 04/08/2015  Pneumococcal Vaccination: Pneumovax 23 10/2016, needs Prevnar 13 Zoster Vaccination: Had Zostavax 2013. Frequency of Dental evaluation: Q4 months Frequency of Eye evaluation: annually   Patient Active Problem List   Diagnosis Date Noted  . S/P TKR (total knee replacement) using cement, right 11/06/2016  . 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 Arthritis   . Insomnia, persistent 04/25/2012  . Anxiety disorder 04/25/2012  . Obesity, Class III, BMI 40-49.9 (morbid obesity) ([place]) 04/25/2012  . Hyperlipidemia 04/25/2012    Past Medical History:  Diagnosis Date  . Anxiety   . Arthritis    RIGHT knee, bone on bone  . Cancer (HCC)    basal cell CA-face  . Family history of adverse  reaction to anesthesia    sister n&v  . Hepatitis    as a baby  . Hyperlipidemia   . Left ankle injury 12/11/2012     Prior to Admission medications   Medication Sig Start Date End Date Taking? Authorizing Provider  aspirin 81 MG tablet Take 81 mg by mouth every other day.   Yes [provider]  Biotin 5000 MCG TABS Take by mouth.   Yes [provider]  calcium carbonate (OS-CAL) 600 MG TABS Take 600 mg by mouth 2 (two) times daily with a meal.   Yes [provider]  Cholecalciferol (VITAMIN D-3) 5000 units TABS Take 5,000 mg by mouth daily.   Yes [provider]  Coenzyme Q10 (CO Q 10 PO) Take 1 capsule by mouth daily.   Yes [provider]  ezetimibe (ZETIA) 10 MG tablet TAKE ONE TABLET BY MOUTH ONE TIME DAILY  07/26/17  Yes Jamilet Ambroise, PA-C  Glucosamine HCl (CVS GLUCOSAMINE) 1500 MG TABS Take 1,500 mg by mouth 2 (two) times daily.   Yes [provider]  Javier Docker Oil 500 MG CAPS Take 500 mg by mouth daily.   Yes [provider]  LORazepam (ATIVAN) 2 MG tablet take 1/2 to 1 tablet by mouth at bedtime if needed for insomnia 06/20/17  Yes Estefanny Moler, PA-C  Multiple Vitamin (MULTIVITAMIN WITH MINERALS) TABS tablet Take 1 tablet by mouth daily.   Yes [provider]  sertraline (ZOLOFT) 25 MG tablet Take 1 tablet (25 mg total) by mouth daily. 03/13/17  Yes Harrison Mons, PA-C  Allergies  Allergen Reactions  . No Known Allergies     Past Surgical History:  Procedure Laterality Date  . APPENDECTOMY    . BREAST LUMPECTOMY WITH RADIOACTIVE SEED LOCALIZATION Right 01/11/2016   Procedure: RIGHT BREAST LUMPECTOMY WITH RADIOACTIVE SEED LOCALIZATION;  Surgeon: Coralie Keens, MD;  Location: [facility];  Service: General;  Laterality: Right;  RIGHT BREAST LUMPECTOMY WITH RADIOACTIVE SEED LOCALIZATION  . COLONOSCOPY    . GANGLION CYST EXCISION Right    wrist  . KNEE SURGERY  2001   right arthroscopy    . TOTAL KNEE ARTHROPLASTY Right 11/06/2016  . TOTAL KNEE ARTHROPLASTY Right 11/06/2016   Procedure: RIGHT TOTAL KNEE ARTHROPLASTY;  Surgeon: Vickey Huger, MD;  Location: [facility];  Service: Orthopedics;  Laterality: Right;    Family History  Problem Relation Age of Onset  . Colon cancer Father 50  . Leukemia Father   . Heart attack Father   . Hyperlipidemia Father   . Hypertension Father   . Colon cancer Maternal Grandmother        DX  AGE 70'S  . Alcohol abuse Maternal Grandmother   . Muscular dystrophy Mother 74  . Alcohol abuse Maternal Grandfather   . Emphysema Maternal Grandfather   . Asthma Son   . Esophageal cancer Neg Hx   . Stomach cancer Neg Hx   . Rectal cancer Neg Hx   . Diabetes Neg Hx   . Sudden death Neg Hx     Social History   Socioeconomic History  . Marital status: Married    Spouse name: Clair Gulling  . Number of children: 3  . Years of education: some college  . Highest education level: None  Social Needs  . Financial resource strain: None  . Food insecurity - worry: None  . Food insecurity - inability: None  . Transportation needs - medical: None  . Transportation needs - non-medical: None  Occupational History  . 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 Smoking status: Never Smoker  . Smokeless tobacco: Never Used  Substance and Sexual Activity  . Alcohol use: Yes    Comment: 2-3 a month  . Drug use: No  . Sexual activity: Yes  Other Topics Concern  . None  Social History Narrative   Lives with her husband.   They have been together since she was 67 years old. They married in 1971.   She left college after 2 years when they married and moved to [place], where Clair Gulling was stationed in [employer].   Their 3 adult children live in [place], [place], and [place].   One sister lives locally.   Her other sister lives in their hometown on [place], [place].       Review of Systems  Constitutional: Negative.   HENT: Negative.   Eyes:  Negative.   Respiratory: Positive for cough. Negative for apnea, choking, chest tightness, shortness of breath, wheezing and stridor.   Cardiovascular: Negative.   Gastrointestinal: Negative.   Endocrine: Negative.   Genitourinary: Negative.   Musculoskeletal: Negative.   Skin: Negative.   Allergic/Immunologic: Positive for environmental allergies. Negative for food allergies and immunocompromised state.  Neurological: Negative.   Hematological: Negative.   Psychiatric/Behavioral: Negative.         Objective:  Physical Exam  Constitutional: She is oriented to person, place, and time. Vital signs are normal. She appears well-developed and well-nourished. She is active and cooperative. No distress.  BP 130/80 (BP Location: Left Arm,  Patient Position: Sitting, Cuff Size: Large)   Pulse 78   Temp 97.9 F (36.6 C) (Oral)   Resp 18   Ht 5\' 2"  (1.575 m)   Wt 216 lb 9.6 oz (98.2 kg)   SpO2 97%   BMI 39.62 kg/m    HENT:  Head: Normocephalic and atraumatic.  Right Ear: Hearing, tympanic membrane, external ear and ear canal normal. No foreign bodies.  Left Ear: Hearing, tympanic membrane, external ear and ear canal normal. No foreign bodies.  Nose: Nose normal.  Mouth/Throat: Uvula is midline, oropharynx is clear and moist and mucous membranes are normal. No oral lesions. Normal dentition. No dental abscesses or uvula swelling. No oropharyngeal exudate.  Eyes: Conjunctivae, EOM and lids are normal. Pupils are equal, round, and reactive to light. Right eye exhibits no discharge. Left eye exhibits no discharge. No scleral icterus.  Fundoscopic exam:      The right eye shows no arteriolar narrowing, no AV nicking, no exudate, no hemorrhage and no papilledema. The right eye shows red reflex.       The left eye shows no arteriolar narrowing, no AV nicking, no exudate, no hemorrhage and no papilledema. The left eye shows red reflex.  Neck: Trachea normal, normal range of motion and full  passive range of motion without pain. Neck supple. No spinous process tenderness and no muscular tenderness present. No thyroid mass and no thyromegaly present.  Cardiovascular: Normal rate, regular rhythm, normal heart sounds, intact distal pulses and normal pulses.  Pulmonary/Chest: Effort normal and breath sounds normal. Right breast exhibits no inverted nipple, no mass, no nipple discharge, no skin change and no tenderness. Left breast exhibits no inverted nipple, no mass, no nipple discharge, no skin change and no tenderness. Breasts are symmetrical.  Musculoskeletal: She exhibits no edema or tenderness.       Cervical back: Normal.       Thoracic back: Normal.       Lumbar back: Normal.  Lymphadenopathy:       Head (right side): No tonsillar, no preauricular, no posterior auricular and no occipital adenopathy present.       Head (left side): No tonsillar, no preauricular, no posterior auricular and no occipital adenopathy present.    She has no cervical adenopathy.       Right: No supraclavicular adenopathy present.       Left: No supraclavicular adenopathy present.  Neurological: She is alert and oriented to person, place, and time. She has normal strength and normal reflexes. No cranial nerve deficit. She exhibits normal muscle tone. Coordination and gait normal.  Skin: Skin is warm, dry and intact. No rash noted. She is not diaphoretic. No cyanosis or erythema. Nails show no clubbing.  Psychiatric: She has a normal mood and affect. Her speech is normal and behavior is normal. Judgment and thought content normal.           Assessment & Plan:   Problem List Items Addressed This Visit    Insomnia, persistent    Stable.      Relevant Medications   ezetimibe (ZETIA) 10 MG tablet   LORazepam (ATIVAN) 2 MG tablet   Anxiety disorder    Stable. Controlled. Monitor closely when her husband moves back to [place].      Relevant Medications   LORazepam (ATIVAN) 2 MG tablet    Obesity, Class III, BMI 40-49.9 (morbid obesity) ([facility])    Recommend increasing exercise to 150 minutes/week. Healthy eating choices.  Hyperlipidemia    Await labs. Adjust regimen as indicated by results.       Relevant Medications   ezetimibe (ZETIA) 10 MG tablet   Other Relevant Orders   Comprehensive metabolic panel (Completed)   Lipid panel (Completed)    Other Visit Diagnoses    Annual physical exam    -  Primary   Age appropriate health guidance provided.   Hyperglycemia       Relevant Orders   Comprehensive metabolic panel (Completed)   Hemoglobin A1c (Completed)   Estrogen deficiency       Encouraged her to schedule DEXA.   Cough       Recent bronchitis. Resolving.       Return in about 1 year (around 08/01/2018) for Annual Exam with PA Jacqulynn Cadet; AWV with Dewitt Hoes as able.   Fara Chute, PA-C [facility] at [facility]

## 2017-08-02 LAB — LIPID PANEL
CHOLESTEROL TOTAL: 223 mg/dL — AB (ref 100–199)
Chol/HDL Ratio: 5.1 ratio — ABNORMAL HIGH (ref 0.0–4.4)
HDL: 44 mg/dL (ref >39)
LDL Calculated: 157 mg/dL — ABNORMAL HIGH (ref 0–99)
Triglycerides: 112 mg/dL (ref 0–149)
VLDL Cholesterol Cal: 22 mg/dL (ref 5–40)

## 2017-08-02 LAB — COMPREHENSIVE METABOLIC PANEL
A/G RATIO: 1.4 (ref 1.2–2.2)
ALBUMIN: 4.2 g/dL (ref 3.6–4.8)
ALK PHOS: 129 IU/L — AB (ref 39–117)
ALT: 19 IU/L (ref 0–32)
AST: 22 IU/L (ref 0–40)
BILIRUBIN TOTAL: 0.7 mg/dL (ref 0.0–1.2)
BUN / CREAT RATIO: 19 (ref 12–28)
BUN: 17 mg/dL (ref 8–27)
CHLORIDE: 105 mmol/L (ref 96–106)
CO2: 22 mmol/L (ref 20–29)
Calcium: 9.2 mg/dL (ref 8.7–10.3)
Creatinine, Ser: 0.88 mg/dL (ref 0.57–1.00)
GFR calc non Af Amer: 68 mL/min/{1.73_m2} (ref >59)
GFR, EST AFRICAN AMERICAN: 79 mL/min/{1.73_m2} (ref >59)
GLUCOSE: 90 mg/dL (ref 65–99)
Globulin, Total: 3 g/dL (ref 1.5–4.5)
POTASSIUM: 4 mmol/L (ref 3.5–5.2)
Sodium: 141 mmol/L (ref 134–144)
TOTAL PROTEIN: 7.2 g/dL (ref 6.0–8.5)

## 2017-08-02 LAB — HEMOGLOBIN A1C
ESTIMATED AVERAGE GLUCOSE: 111 mg/dL
HEMOGLOBIN A1C: 5.5 % (ref 4.8–5.6)

## 2017-08-06 ENCOUNTER — Encounter: Payer: Self-pay | Admitting: Physician Assistant

## 2017-08-06 NOTE — Assessment & Plan Note (Addendum)
Stable. Controlled. Monitor closely when her husband moves back to [place].

## 2017-08-06 NOTE — Assessment & Plan Note (Signed)
Stable

## 2017-08-06 NOTE — Assessment & Plan Note (Signed)
Recommend increasing exercise to 150 minutes/week. Healthy eating choices.

## 2017-08-06 NOTE — Assessment & Plan Note (Signed)
Await labs. Adjust regimen as indicated by results.  

## 2017-08-07 ENCOUNTER — Other Ambulatory Visit: Payer: Self-pay

## 2017-08-07 ENCOUNTER — Ambulatory Visit (INDEPENDENT_AMBULATORY_CARE_PROVIDER_SITE_OTHER): Payer: Medicare HMO | Admitting: Physician Assistant

## 2017-08-07 ENCOUNTER — Encounter: Payer: Self-pay | Admitting: Physician Assistant

## 2017-08-07 VITALS — BP 126/72 | HR 91 | Temp 98.6°F | Resp 18 | Ht 62.0 in | Wt 218.8 lb

## 2017-08-07 DIAGNOSIS — H6123 Impacted cerumen, bilateral: Secondary | ICD-10-CM

## 2017-08-07 NOTE — Progress Notes (Signed)
     Patient ID: Deanna Allen, female    DOB: Mar 09, 1951, 67 y.o.   MRN: [id number]  PCP: Harrison Mons, PA-C  Chief Complaint  Patient presents with  . Ear Fullness    Pt states she can't hear well out of both ears.    Subjective:   Presents for evaluation of cerumenosis.  I noted increased cerumen in both ears at the time of her last visit (08/01/2017), but she declined irrigation at that time. Today her ears feel clogged and uncomfortable, and she is ready for the irrigation..    Review of Systems     Patient Active Problem List   Diagnosis Date Noted  . S/P TKR (total knee replacement) using cement, right 11/06/2016  . 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 Arthritis   . Insomnia, persistent 04/25/2012  . Anxiety disorder 04/25/2012  . Obesity, Class III, BMI 40-49.9 (morbid obesity) ([facility]) 04/25/2012  . Hyperlipidemia 04/25/2012     Prior to Admission medications   Medication Sig Start Date End Date Taking? Authorizing Provider  aspirin 81 MG tablet Take 81 mg by mouth every other day.   Yes [provider]  Biotin 5000 MCG TABS Take by mouth.   Yes [provider]  calcium carbonate (OS-CAL) 600 MG TABS Take 600 mg by mouth 2 (two) times daily with a meal.   Yes [provider]  Cholecalciferol (VITAMIN D-3) 5000 units TABS Take 5,000 mg by mouth daily.   Yes [provider]  Coenzyme Q10 (CO Q 10 PO) Take 1 capsule by mouth daily.   Yes [provider]  ezetimibe (ZETIA) 10 MG tablet Take 1 tablet (10 mg total) by mouth daily. 08/01/17  Yes Shamarcus Hoheisel, PA-C  Glucosamine HCl (CVS GLUCOSAMINE) 1500 MG TABS Take 1,500 mg by mouth 2 (two) times daily.   Yes [provider]  Javier Docker Oil 500 MG CAPS Take 500 mg by mouth daily.   Yes [provider]  LORazepam (ATIVAN) 2 MG tablet take 1/2 to 1 tablet by mouth at bedtime if needed for insomnia 08/01/17  Yes Jacquel Mccamish, PA-C  Multiple Vitamin (MULTIVITAMIN  WITH MINERALS) TABS tablet Take 1 tablet by mouth daily.   Yes [provider]  sertraline (ZOLOFT) 25 MG tablet Take 1 tablet (25 mg total) by mouth daily. 03/13/17  Yes Harrison Mons, PA-C     Allergies  Allergen Reactions  . No Known Allergies        Objective:  Physical Exam  Constitutional: She is oriented to person, place, and time. She appears well-developed and well-nourished. She is active and cooperative. No distress.  BP 126/72 (BP Location: Left Arm, Patient Position: Sitting, Cuff Size: Large)   Pulse 91   Temp 98.6 F (37 C) (Oral)   Resp 18   Ht 5\' 2"  (1.575 m)   Wt 218 lb 12.8 oz (99.2 kg)   SpO2 97%   BMI 40.02 kg/m    HENT:  Bilateral cerumen impaction, cleared with irrigation  Eyes: Conjunctivae are normal.  Pulmonary/Chest: Effort normal.  Neurological: She is alert and oriented to person, place, and time.  Psychiatric: She has a normal mood and affect. Her speech is normal and behavior is normal.           Assessment & Plan:   1. Bilateral impacted cerumen Resolved with irrigation.    No Follow-up on file.   Fara Chute, PA-C Primary Care at [facility]

## 2017-08-07 NOTE — Patient Instructions (Signed)
     IF you received an x-ray today, you will receive an invoice from [facility]. Please contact [facility] at [phone] with questions or concerns regarding your invoice.   IF you received labwork today, you will receive an invoice from [facility]. Please contact [facility] at [phone] with questions or concerns regarding your invoice.   Our billing staff will not be able to assist you with questions regarding bills from these companies.  You will be contacted with the lab results as soon as they are available. The fastest way to get your results is to activate your My Chart account. Instructions are located on the last page of this paperwork. If you have not heard from us regarding the results in 2 weeks, please contact this office.     

## 2017-08-10 DIAGNOSIS — R69 Illness, unspecified: Secondary | ICD-10-CM | POA: Diagnosis not present

## 2017-09-22 ENCOUNTER — Other Ambulatory Visit: Payer: Self-pay | Admitting: Physician Assistant

## 2017-09-22 DIAGNOSIS — G4700 Insomnia, unspecified: Secondary | ICD-10-CM

## 2017-09-22 DIAGNOSIS — F419 Anxiety disorder, unspecified: Secondary | ICD-10-CM

## 2017-09-24 NOTE — Telephone Encounter (Signed)
OV 08/01/17 with Harrison Mons / Refill request for xanax

## 2017-10-29 ENCOUNTER — Encounter: Payer: Self-pay | Admitting: Physician Assistant

## 2017-10-31 ENCOUNTER — Encounter: Payer: Self-pay | Admitting: Physician Assistant

## 2017-11-06 ENCOUNTER — Other Ambulatory Visit: Payer: Self-pay | Admitting: Physician Assistant

## 2017-11-06 DIAGNOSIS — G4700 Insomnia, unspecified: Secondary | ICD-10-CM

## 2017-11-06 DIAGNOSIS — F419 Anxiety disorder, unspecified: Secondary | ICD-10-CM

## 2017-11-07 NOTE — Telephone Encounter (Signed)
Patient is requesting a refill of the following medications: Requested Prescriptions   Pending Prescriptions Disp Refills  . LORazepam (ATIVAN) 2 MG tablet [Pharmacy Med Name: LORAZEPAM 2MG  TABLETS] 30 tablet 0    Sig: TAKE 1/2 TO 1 TABLET BY MOUTH AT BEDTIME IF NEEDED FOR INSOMNIA    Date of patient request: 11/07/17 Last office visit: 08/01/17 Date of last refill: 09/26/17 Last refill amount: #30 0RF Follow up time period per chart: 1 year

## 2017-11-07 NOTE — Telephone Encounter (Signed)
Rx sent electronically.  Meds ordered this encounter  Medications  . LORazepam (ATIVAN) 2 MG tablet    Sig: TAKE 1/2 TO 1 TABLET BY MOUTH AT BEDTIME IF NEEDED FOR INSOMNIA    Dispense:  30 tablet    Refill:  0

## 2017-11-07 NOTE — Telephone Encounter (Signed)
Ativan 2 mg tablet refill request  LOV 08/01/17 with [employer]  [facility] [address] [place] - [address].

## 2017-11-15 DIAGNOSIS — Z471 Aftercare following joint replacement surgery: Secondary | ICD-10-CM | POA: Diagnosis not present

## 2017-11-15 DIAGNOSIS — Z96651 Presence of right artificial knee joint: Secondary | ICD-10-CM | POA: Diagnosis not present

## 2018-02-04 DIAGNOSIS — R69 Illness, unspecified: Secondary | ICD-10-CM | POA: Diagnosis not present

## 2018-02-22 ENCOUNTER — Other Ambulatory Visit: Payer: Self-pay | Admitting: Family Medicine

## 2018-02-22 ENCOUNTER — Telehealth: Payer: Self-pay | Admitting: Family Medicine

## 2018-02-22 DIAGNOSIS — G4700 Insomnia, unspecified: Secondary | ICD-10-CM

## 2018-02-22 DIAGNOSIS — F419 Anxiety disorder, unspecified: Secondary | ICD-10-CM

## 2018-02-22 MED ORDER — LORAZEPAM 2 MG PO TABS: 1.0000 mg | ORAL_TABLET | Freq: Every day | ORAL | 0 refills | Status: DC | PRN

## 2018-02-22 MED ORDER — EZETIMIBE 10 MG PO TABS: 10.0000 mg | ORAL_TABLET | Freq: Every day | ORAL | 0 refills | Status: DC

## 2018-02-22 MED ORDER — SERTRALINE HCL 25 MG PO TABS: 25.0000 mg | ORAL_TABLET | Freq: Every day | ORAL | 0 refills | Status: DC

## 2018-02-22 NOTE — Telephone Encounter (Signed)
Copied from [facility] [id number]. Topic: Quick Communication - Rx Refill/Question >> Feb 22, 2018  9:40 AM Carolyn Stare wrote: Medication LORAZEPAM,SERTRALINE , ezetimibe (ZETIA) 10 MG tablet    Pt not seen since 1/19  Has the patient contacted their pharmacy yes    Preferred Pharmacy  Agent: Please be advised that RX refills may take up to 3 business days. We ask that you follow-up with your pharmacy.

## 2018-02-22 NOTE — Telephone Encounter (Signed)
Please advise 

## 2018-02-22 NOTE — Telephone Encounter (Signed)
pmp reviewed. Appropriate. Last lorazepam rx April. Takes prn Medications refilled for 30 days Please schedule appt to establish with new PCP as Daphane Shepherd no longer with PCP. thanks

## 2018-03-06 DIAGNOSIS — Z1231 Encounter for screening mammogram for malignant neoplasm of breast: Secondary | ICD-10-CM | POA: Diagnosis not present

## 2018-03-13 DIAGNOSIS — R69 Illness, unspecified: Secondary | ICD-10-CM | POA: Diagnosis not present

## 2018-03-24 ENCOUNTER — Other Ambulatory Visit: Payer: Self-pay | Admitting: Family Medicine

## 2018-03-24 DIAGNOSIS — F419 Anxiety disorder, unspecified: Secondary | ICD-10-CM

## 2018-03-24 DIAGNOSIS — G4700 Insomnia, unspecified: Secondary | ICD-10-CM

## 2018-04-16 ENCOUNTER — Other Ambulatory Visit: Payer: Self-pay | Admitting: Family Medicine

## 2018-04-16 DIAGNOSIS — G4700 Insomnia, unspecified: Secondary | ICD-10-CM

## 2018-04-19 DIAGNOSIS — R69 Illness, unspecified: Secondary | ICD-10-CM | POA: Diagnosis not present

## 2018-04-22 ENCOUNTER — Ambulatory Visit (INDEPENDENT_AMBULATORY_CARE_PROVIDER_SITE_OTHER): Payer: Medicare HMO | Admitting: Family Medicine

## 2018-04-22 ENCOUNTER — Other Ambulatory Visit: Payer: Self-pay

## 2018-04-22 ENCOUNTER — Encounter: Payer: Self-pay | Admitting: Family Medicine

## 2018-04-22 VITALS — BP 143/79 | HR 72 | Temp 97.9°F | Resp 18 | Ht 62.0 in | Wt 222.8 lb

## 2018-04-22 DIAGNOSIS — E785 Hyperlipidemia, unspecified: Secondary | ICD-10-CM | POA: Diagnosis not present

## 2018-04-22 DIAGNOSIS — F419 Anxiety disorder, unspecified: Secondary | ICD-10-CM

## 2018-04-22 DIAGNOSIS — G4700 Insomnia, unspecified: Secondary | ICD-10-CM | POA: Diagnosis not present

## 2018-04-22 DIAGNOSIS — R69 Illness, unspecified: Secondary | ICD-10-CM | POA: Diagnosis not present

## 2018-04-22 MED ORDER — LORAZEPAM 0.5 MG PO TABS: 0.5000 mg | ORAL_TABLET | Freq: Every day | ORAL | 0 refills | Status: DC | PRN

## 2018-04-22 MED ORDER — EZETIMIBE 10 MG PO TABS: 10.0000 mg | ORAL_TABLET | Freq: Every day | ORAL | 1 refills | Status: DC

## 2018-04-22 MED ORDER — SERTRALINE HCL 25 MG PO TABS: 25.0000 mg | ORAL_TABLET | Freq: Every day | ORAL | 1 refills | Status: DC

## 2018-04-22 NOTE — Progress Notes (Signed)
9/30/20191:28 PM  Deanna Allen 10-16-1950, 67 y.o. female [id number]  Chief Complaint  Patient presents with  . Medication Refill    lorazepam,sertraline, and ezetimibe      HPI:   Patient is a 67 y.o. female with past medical history significant for anxiety, hyperlipidemia who presents today to establish care  Previous PCP Harrison Mons Last lorazepam rx 02/23/18 # 30 CPE in Jan  Takes sertraline 25mg  once a day Lorazepam used to take every night, 6 weeks ago cut herself off, would like a small rx just in case Has insomnia, started after her son developed night terrors for 11 years Tried sleep therapy did not help Has never had a sleep study Does not nap during the day Takes care of her husband who has parkisnson  Already has had flu vaccine this season  Takes zetia daily as rx. Will get labs with CPE in Jan  Otherwise feeling well Has no acute concerns today  Fall Risk  08/07/2017 08/01/2017 03/13/2017 02/15/2017 01/15/2017  Falls in the past year? No No No No No     Depression screen [facility] 2/9 08/07/2017 08/01/2017 03/13/2017  Decreased Interest 0 0 0  Down, Depressed, Hopeless 0 0 0  PHQ - 2 Score 0 0 0    Allergies  Allergen Reactions  . No Known Allergies     Prior to Admission medications   Medication Sig Start Date End Date Taking? Authorizing Provider  Biotin 5000 MCG TABS Take by mouth.   Yes [provider]  calcium carbonate (OS-CAL) 600 MG TABS Take 600 mg by mouth 2 (two) times daily with a meal.   Yes [provider]  Cholecalciferol (VITAMIN D-3) 5000 units TABS Take 5,000 mg by mouth daily.   Yes [provider]  ezetimibe (ZETIA) 10 MG tablet Take 1 tablet (10 mg total) by mouth daily. 02/22/18  Yes Rutherford Guys, MD  Glucosamine HCl (CVS GLUCOSAMINE) 1500 MG TABS Take 1,500 mg by mouth 2 (two) times daily.   Yes [provider]  Javier Docker Oil 500 MG CAPS Take 500 mg by mouth daily.   Yes [provider]    LORazepam (ATIVAN) 2 MG tablet Take 0.5-1 tablets (1-2 mg total) by mouth daily as needed for anxiety. 02/22/18  Yes Rutherford Guys, MD  Multiple Vitamin (MULTIVITAMIN WITH MINERALS) TABS tablet Take 1 tablet by mouth daily.   Yes [provider]  sertraline (ZOLOFT) 25 MG tablet Take 1 tablet (25 mg total) by mouth daily. 02/22/18  Yes Rutherford Guys, MD  aspirin 81 MG tablet Take 81 mg by mouth every other day.    [provider]  Coenzyme Q10 (CO Q 10 PO) Take 1 capsule by mouth daily.    [provider]    Past Medical History:  Diagnosis Date  . Anxiety   . Arthritis    RIGHT knee, bone on bone  . Cancer (HCC)    basal cell CA-face  . Family history of adverse reaction to anesthesia    sister n&v  . Hepatitis    as a baby  . Hyperlipidemia   . Left ankle injury 12/11/2012    Past Surgical History:  Procedure Laterality Date  . APPENDECTOMY    . BREAST LUMPECTOMY WITH RADIOACTIVE SEED LOCALIZATION Right 01/11/2016   Procedure: RIGHT BREAST LUMPECTOMY WITH RADIOACTIVE SEED LOCALIZATION;  Surgeon: Coralie Keens, MD;  Location: [facility];  Service: General;  Laterality: Right;  RIGHT BREAST  LUMPECTOMY WITH RADIOACTIVE SEED LOCALIZATION  . COLONOSCOPY    . GANGLION CYST EXCISION Right    wrist  . KNEE SURGERY  2001   right arthroscopy  . TOTAL KNEE ARTHROPLASTY Right 11/06/2016  . TOTAL KNEE ARTHROPLASTY Right 11/06/2016   Procedure: RIGHT TOTAL KNEE ARTHROPLASTY;  Surgeon: Vickey Huger, MD;  Location: [facility];  Service: Orthopedics;  Laterality: Right;    Social History   Tobacco Use  . Smoking status: Never Smoker  . Smokeless tobacco: Never Used  Substance Use Topics  . Alcohol use: Yes    Comment: 2-3 a month    Family History  Problem Relation Age of Onset  . Colon cancer Father 63  . Leukemia Father   . Heart attack Father   . Hyperlipidemia Father   . Hypertension Father   . Colon cancer Maternal Grandmother         DX  AGE 42'S  . Alcohol abuse Maternal Grandmother   . Muscular dystrophy Mother 38  . Alcohol abuse Maternal Grandfather   . Emphysema Maternal Grandfather   . Asthma Son   . Esophageal cancer Neg Hx   . Stomach cancer Neg Hx   . Rectal cancer Neg Hx   . Diabetes Neg Hx   . Sudden death Neg Hx     ROS Per hpi  OBJECTIVE:  Blood pressure (!) 143/79, pulse 72, temperature 97.9 F (36.6 C), temperature source Oral, resp. rate 18, height 5\' 2"  (1.575 m), weight 222 lb 12.8 oz (101.1 kg), SpO2 97 %. Body mass index is 40.75 kg/m.   Physical Exam  Constitutional: She is oriented to person, place, and time. She appears well-developed and well-nourished.  HENT:  Head: Normocephalic and atraumatic.  Mouth/Throat: Mucous membranes are normal.  Eyes: Pupils are equal, round, and reactive to light. Conjunctivae and EOM are normal. No scleral icterus.  Neck: Neck supple.  Pulmonary/Chest: Effort normal.  Neurological: She is alert and oriented to person, place, and time.  Skin: Skin is warm and dry.  Psychiatric: She has a normal mood and affect.  Nursing note and vitals reviewed.   ASSESSMENT and PLAN  1. Anxiety disorder, unspecified type Well controlled. Cont current regime - sertraline (ZOLOFT) 25 MG tablet; Take 1 tablet (25 mg total) by mouth daily. - LORazepam (ATIVAN) 0.5 MG tablet; Take 1 tablet (0.5 mg total) by mouth daily as needed for anxiety.  2. Insomnia, persistent Stable. Sign affected by life circumstances. Has come to terms.  - LORazepam (ATIVAN) 0.5 MG tablet; Take 1 tablet (0.5 mg total) by mouth daily as needed for anxiety.  3. Hyperlipidemia, unspecified hyperlipidemia type Stable. Cont current regime. Check labs during CPE - ezetimibe (ZETIA) 10 MG tablet; Take 1 tablet (10 mg total) by mouth daily.  Return in about 4 months (around 08/22/2018) for CPE.    Rutherford Guys, MD Primary Care at [facility] [place], [place] [address] Ph.   [phone] Fax [phone]

## 2018-04-22 NOTE — Patient Instructions (Signed)
° ° ° °  If you have lab work done today you will be contacted with your lab results within the next 2 weeks.  If you have not heard from us then please contact us. The fastest way to get your results is to register for My Chart. ° ° °IF you received an x-ray today, you will receive an invoice from [facility]. Please contact [facility] at [phone] with questions or concerns regarding your invoice.  ° °IF you received labwork today, you will receive an invoice from [facility]. Please contact [facility] at [phone] with questions or concerns regarding your invoice.  ° °Our billing staff will not be able to assist you with questions regarding bills from these companies. ° °You will be contacted with the lab results as soon as they are available. The fastest way to get your results is to activate your My Chart account. Instructions are located on the last page of this paperwork. If you have not heard from us regarding the results in 2 weeks, please contact this office. °  ° ° ° °

## 2018-04-23 ENCOUNTER — Telehealth: Payer: Self-pay | Admitting: Family Medicine

## 2018-04-23 ENCOUNTER — Telehealth: Payer: Self-pay

## 2018-04-23 DIAGNOSIS — F419 Anxiety disorder, unspecified: Secondary | ICD-10-CM

## 2018-04-23 DIAGNOSIS — G4700 Insomnia, unspecified: Secondary | ICD-10-CM

## 2018-04-23 MED ORDER — EZETIMIBE 10 MG PO TABS: 10.0000 mg | ORAL_TABLET | Freq: Every day | ORAL | 1 refills | Status: DC

## 2018-04-23 MED ORDER — SERTRALINE HCL 25 MG PO TABS: 25.0000 mg | ORAL_TABLET | Freq: Every day | ORAL | 1 refills | Status: DC

## 2018-04-23 MED ORDER — LORAZEPAM 0.5 MG PO TABS: 0.5000 mg | ORAL_TABLET | Freq: Every day | ORAL | 0 refills | Status: DC | PRN

## 2018-04-23 NOTE — Telephone Encounter (Signed)
done

## 2018-04-23 NOTE — Telephone Encounter (Signed)
pls resend the lorazapam for this pt. Was e-scribed to the wrong pharmacy

## 2018-04-23 NOTE — Telephone Encounter (Signed)
error 

## 2018-04-23 NOTE — Addendum Note (Signed)
Addended by: Amalia Hailey on: 04/23/2018 02:03 PM   Modules accepted: Orders

## 2018-05-10 DIAGNOSIS — H524 Presbyopia: Secondary | ICD-10-CM | POA: Diagnosis not present

## 2018-05-10 DIAGNOSIS — H5202 Hypermetropia, left eye: Secondary | ICD-10-CM | POA: Diagnosis not present

## 2018-05-10 DIAGNOSIS — H52223 Regular astigmatism, bilateral: Secondary | ICD-10-CM | POA: Diagnosis not present

## 2018-12-11 ENCOUNTER — Other Ambulatory Visit: Payer: Self-pay | Admitting: Family Medicine

## 2018-12-11 DIAGNOSIS — G4700 Insomnia, unspecified: Secondary | ICD-10-CM

## 2018-12-11 DIAGNOSIS — F419 Anxiety disorder, unspecified: Secondary | ICD-10-CM

## 2018-12-11 NOTE — Telephone Encounter (Signed)
Pt made an appt with Dr. Pamella Pert for 12/27/2018 which was the earliest appt. Pt states she will be out of her medication this week. The medications are Ativan, Zoloft, and Zetia. Pharmacy that she uses is [facility] [address]

## 2018-12-12 ENCOUNTER — Other Ambulatory Visit: Payer: Self-pay

## 2018-12-12 DIAGNOSIS — G4700 Insomnia, unspecified: Secondary | ICD-10-CM

## 2018-12-12 DIAGNOSIS — F419 Anxiety disorder, unspecified: Secondary | ICD-10-CM

## 2018-12-12 NOTE — Telephone Encounter (Signed)
Pt is requesting medication, she will run out before her nx appt 12/27/2018

## 2018-12-13 MED ORDER — LORAZEPAM 0.5 MG PO TABS: 0.5000 mg | ORAL_TABLET | Freq: Every day | ORAL | 0 refills | Status: AC | PRN

## 2018-12-13 MED ORDER — EZETIMIBE 10 MG PO TABS: 10.0000 mg | ORAL_TABLET | Freq: Every day | ORAL | 0 refills | Status: AC

## 2018-12-13 MED ORDER — SERTRALINE HCL 25 MG PO TABS: 25.0000 mg | ORAL_TABLET | Freq: Every day | ORAL | 0 refills | Status: AC

## 2018-12-27 ENCOUNTER — Telehealth: Payer: Medicare HMO | Admitting: Family Medicine

## 2019-01-15 DIAGNOSIS — H6123 Impacted cerumen, bilateral: Secondary | ICD-10-CM | POA: Diagnosis not present

## 2019-01-15 DIAGNOSIS — H6693 Otitis media, unspecified, bilateral: Secondary | ICD-10-CM | POA: Diagnosis not present

## 2019-04-04 DIAGNOSIS — Z1231 Encounter for screening mammogram for malignant neoplasm of breast: Secondary | ICD-10-CM | POA: Diagnosis not present

## 2019-04-18 DIAGNOSIS — R69 Illness, unspecified: Secondary | ICD-10-CM | POA: Diagnosis not present

## 2019-04-22 DIAGNOSIS — R69 Illness, unspecified: Secondary | ICD-10-CM | POA: Diagnosis not present

## 2019-04-22 DIAGNOSIS — F5103 Paradoxical insomnia: Secondary | ICD-10-CM | POA: Diagnosis not present

## 2019-08-07 DIAGNOSIS — R69 Illness, unspecified: Secondary | ICD-10-CM | POA: Diagnosis not present

## 2019-08-07 DIAGNOSIS — H6123 Impacted cerumen, bilateral: Secondary | ICD-10-CM | POA: Diagnosis not present

## 2019-08-07 DIAGNOSIS — E785 Hyperlipidemia, unspecified: Secondary | ICD-10-CM | POA: Diagnosis not present

## 2019-08-07 DIAGNOSIS — Z6839 Body mass index (BMI) 39.0-39.9, adult: Secondary | ICD-10-CM | POA: Diagnosis not present

## 2019-08-07 DIAGNOSIS — Z131 Encounter for screening for diabetes mellitus: Secondary | ICD-10-CM | POA: Diagnosis not present

## 2019-08-07 DIAGNOSIS — Z0000 Encounter for general adult medical examination without abnormal findings: Secondary | ICD-10-CM | POA: Diagnosis not present

## 2019-08-07 DIAGNOSIS — Z13220 Encounter for screening for lipoid disorders: Secondary | ICD-10-CM | POA: Diagnosis not present

## 2019-09-01 ENCOUNTER — Ambulatory Visit: Payer: Medicare HMO | Attending: Internal Medicine

## 2019-09-01 DIAGNOSIS — Z23 Encounter for immunization: Secondary | ICD-10-CM

## 2019-09-01 NOTE — Progress Notes (Signed)
   Covid-19 Vaccination Clinic  Name:  WYNELL BRIGANCE    MRN: [id number] DOB: [birth year]  09/01/2019  Ms. Kishbaugh was observed post Covid-19 immunization for 15 minutes without incidence. She was provided with Vaccine Information Sheet and instruction to access the V-Safe system.   Ms. Guthier was instructed to call 911 with any severe reactions post vaccine: Marland Kitchen Difficulty breathing  . Swelling of your face and throat  . A fast heartbeat  . A bad rash all over your body  . Dizziness and weakness    Immunizations Administered    Name Date Dose VIS Date Route   Pfizer COVID-19 Vaccine 09/01/2019 10:05 AM 0.3 mL 07/04/2019 Intramuscular   Manufacturer: [place]   Lot: [id number]   [place]: [id number]

## 2019-09-19 ENCOUNTER — Ambulatory Visit: Payer: Medicare HMO

## 2019-09-24 ENCOUNTER — Ambulatory Visit: Payer: Medicare HMO | Attending: Internal Medicine

## 2019-09-24 DIAGNOSIS — Z23 Encounter for immunization: Secondary | ICD-10-CM

## 2019-09-24 NOTE — Progress Notes (Signed)
   Covid-19 Vaccination Clinic  Name:  Deanna Allen    MRN: [id number] DOB: 04-Aug-1950  09/24/2019  [name] was observed post Covid-19 immunization for 15 minutes without incident. She was provided with Vaccine Information Sheet and instruction to access the V-Safe system.   [name] was instructed to call 911 with any severe reactions post vaccine: Marland Kitchen Difficulty breathing  . Swelling of face and throat  . A fast heartbeat  . A bad rash all over body  . Dizziness and weakness   Immunizations Administered    Name Date Dose VIS Date Route   Pfizer COVID-19 Vaccine 09/24/2019 10:07 AM 0.3 mL 07/04/2019 Intramuscular   Manufacturer: [place]   Lot: [id number]   [place]: [id number]

## 2019-12-23 DIAGNOSIS — H43813 Vitreous degeneration, bilateral: Secondary | ICD-10-CM | POA: Diagnosis not present

## 2020-01-06 DIAGNOSIS — H02834 Dermatochalasis of left upper eyelid: Secondary | ICD-10-CM | POA: Diagnosis not present

## 2020-01-06 DIAGNOSIS — H57813 Brow ptosis, bilateral: Secondary | ICD-10-CM | POA: Diagnosis not present

## 2020-01-06 DIAGNOSIS — H02831 Dermatochalasis of right upper eyelid: Secondary | ICD-10-CM | POA: Diagnosis not present

## 2020-01-06 DIAGNOSIS — L719 Rosacea, unspecified: Secondary | ICD-10-CM | POA: Diagnosis not present

## 2020-02-05 DIAGNOSIS — E785 Hyperlipidemia, unspecified: Secondary | ICD-10-CM | POA: Diagnosis not present

## 2020-02-05 DIAGNOSIS — R69 Illness, unspecified: Secondary | ICD-10-CM | POA: Diagnosis not present

## 2020-02-05 DIAGNOSIS — F5103 Paradoxical insomnia: Secondary | ICD-10-CM | POA: Diagnosis not present

## 2020-02-09 DIAGNOSIS — E785 Hyperlipidemia, unspecified: Secondary | ICD-10-CM | POA: Diagnosis not present

## 2020-02-09 DIAGNOSIS — R69 Illness, unspecified: Secondary | ICD-10-CM | POA: Diagnosis not present

## 2020-02-09 DIAGNOSIS — F5103 Paradoxical insomnia: Secondary | ICD-10-CM | POA: Diagnosis not present

## 2020-02-12 DIAGNOSIS — R69 Illness, unspecified: Secondary | ICD-10-CM | POA: Diagnosis not present

## 2020-02-12 DIAGNOSIS — F5103 Paradoxical insomnia: Secondary | ICD-10-CM | POA: Diagnosis not present

## 2020-02-12 DIAGNOSIS — E785 Hyperlipidemia, unspecified: Secondary | ICD-10-CM | POA: Diagnosis not present

## 2020-03-26 ENCOUNTER — Encounter (HOSPITAL_BASED_OUTPATIENT_CLINIC_OR_DEPARTMENT_OTHER): Payer: Self-pay

## 2020-03-26 ENCOUNTER — Emergency Department (HOSPITAL_BASED_OUTPATIENT_CLINIC_OR_DEPARTMENT_OTHER)
Admission: EM | Admit: 2020-03-26 | Discharge: 2020-03-26 | Disposition: A | Discharge status: Home / Self Care | Payer: Medicare HMO

## 2020-03-26 ENCOUNTER — Emergency Department (HOSPITAL_BASED_OUTPATIENT_CLINIC_OR_DEPARTMENT_OTHER)
Admission: EM | Admit: 2020-03-26 | Discharge: 2020-03-26 | Disposition: A | Discharge status: Home / Self Care | Payer: Medicare HMO | Attending: Emergency Medicine | Admitting: Emergency Medicine

## 2020-03-26 ENCOUNTER — Other Ambulatory Visit: Payer: Self-pay

## 2020-03-26 ENCOUNTER — Emergency Department (HOSPITAL_BASED_OUTPATIENT_CLINIC_OR_DEPARTMENT_OTHER)
Admission: EM | Admit: 2020-03-26 | Discharge: 2020-03-27 | Disposition: A | Discharge status: Home / Self Care | Payer: Medicare HMO | Attending: Emergency Medicine | Admitting: Emergency Medicine

## 2020-03-26 ENCOUNTER — Encounter (HOSPITAL_BASED_OUTPATIENT_CLINIC_OR_DEPARTMENT_OTHER): Payer: Self-pay | Admitting: *Deleted

## 2020-03-26 ENCOUNTER — Emergency Department (HOSPITAL_BASED_OUTPATIENT_CLINIC_OR_DEPARTMENT_OTHER): Payer: Medicare HMO

## 2020-03-26 DIAGNOSIS — Z5321 Procedure and treatment not carried out due to patient leaving prior to being seen by health care provider: Secondary | ICD-10-CM | POA: Diagnosis not present

## 2020-03-26 DIAGNOSIS — Z96651 Presence of right artificial knee joint: Secondary | ICD-10-CM | POA: Diagnosis not present

## 2020-03-26 DIAGNOSIS — Z85828 Personal history of other malignant neoplasm of skin: Secondary | ICD-10-CM | POA: Insufficient documentation

## 2020-03-26 DIAGNOSIS — R1110 Vomiting, unspecified: Secondary | ICD-10-CM | POA: Insufficient documentation

## 2020-03-26 DIAGNOSIS — Z79899 Other long term (current) drug therapy: Secondary | ICD-10-CM | POA: Diagnosis not present

## 2020-03-26 DIAGNOSIS — N134 Hydroureter: Secondary | ICD-10-CM | POA: Diagnosis not present

## 2020-03-26 DIAGNOSIS — R109 Unspecified abdominal pain: Secondary | ICD-10-CM | POA: Insufficient documentation

## 2020-03-26 DIAGNOSIS — N132 Hydronephrosis with renal and ureteral calculous obstruction: Secondary | ICD-10-CM | POA: Diagnosis not present

## 2020-03-26 DIAGNOSIS — N201 Calculus of ureter: Secondary | ICD-10-CM | POA: Diagnosis not present

## 2020-03-26 LAB — CBC
HCT: 44.5 % (ref 36.0–46.0)
Hemoglobin: 15.3 g/dL — ABNORMAL HIGH (ref 12.0–15.0)
MCH: 30.8 pg (ref 26.0–34.0)
MCHC: 34.4 g/dL (ref 30.0–36.0)
MCV: 89.7 fL (ref 80.0–100.0)
Platelets: 207 10*3/uL (ref 150–400)
RBC: 4.96 MIL/uL (ref 3.87–5.11)
RDW: 12.5 % (ref 11.5–15.5)
WBC: 13.1 10*3/uL — ABNORMAL HIGH (ref 4.0–10.5)
nRBC: 0 % (ref 0.0–0.2)

## 2020-03-26 LAB — COMPREHENSIVE METABOLIC PANEL
ALT: 21 U/L (ref 0–44)
AST: 28 U/L (ref 15–41)
Albumin: 4.4 g/dL (ref 3.5–5.0)
Alkaline Phosphatase: 100 U/L (ref 38–126)
Anion gap: 12 (ref 5–15)
BUN: 22 mg/dL (ref 8–23)
CO2: 25 mmol/L (ref 22–32)
Calcium: 9.6 mg/dL (ref 8.9–10.3)
Chloride: 105 mmol/L (ref 98–111)
Creatinine, Ser: 1.46 mg/dL — ABNORMAL HIGH (ref 0.44–1.00)
GFR calc Af Amer: 42 mL/min — ABNORMAL LOW (ref >60)
GFR calc non Af Amer: 36 mL/min — ABNORMAL LOW (ref >60)
Glucose, Bld: 144 mg/dL — ABNORMAL HIGH (ref 70–99)
Potassium: 4.2 mmol/L (ref 3.5–5.1)
Sodium: 142 mmol/L (ref 135–145)
Total Bilirubin: 1 mg/dL (ref 0.3–1.2)
Total Protein: 8 g/dL (ref 6.5–8.1)

## 2020-03-26 LAB — URINALYSIS, ROUTINE W REFLEX MICROSCOPIC
Bilirubin Urine: NEGATIVE
Glucose, UA: NEGATIVE mg/dL
Ketones, ur: >80 mg/dL — AB
Nitrite: NEGATIVE
Protein, ur: NEGATIVE mg/dL
Specific Gravity, Urine: 1.025 (ref 1.005–1.030)
pH: 6 (ref 5.0–8.0)

## 2020-03-26 LAB — URINALYSIS, MICROSCOPIC (REFLEX): RBC / HPF: >50 RBC/hpf (ref 0–5)

## 2020-03-26 LAB — LIPASE, BLOOD: Lipase: 31 U/L (ref 11–51)

## 2020-03-26 MED ORDER — ONDANSETRON HCL 4 MG/2ML IJ SOLN
4.0000 mg | Freq: Once | INTRAMUSCULAR | Status: AC
  Administered 2020-03-26: 4 mg via INTRAVENOUS
  Filled 2020-03-26: qty 2

## 2020-03-26 MED ORDER — FENTANYL CITRATE (PF) 100 MCG/2ML IJ SOLN
50.0000 ug | INTRAMUSCULAR | Status: AC | PRN
  Administered 2020-03-26 (×2): 50 ug via INTRAVENOUS
  Filled 2020-03-26 (×2): qty 2

## 2020-03-26 MED ORDER — IOHEXOL 300 MG/ML  SOLN
80.0000 mL | Freq: Once | INTRAMUSCULAR | Status: AC | PRN
  Administered 2020-03-26: 80 mL via INTRAVENOUS

## 2020-03-26 NOTE — ED Triage Notes (Signed)
Abdominal pain started this morning with vomiting.  Had a hard, small BM today.  Took Milk of Magnesia and she vomited it out. Denies fever.

## 2020-03-26 NOTE — ED Provider Notes (Signed)
[facility] Provider Note: Georgena Spurling, MD, FACEP  CSN: [id number] MRN: [id number] ARRIVAL: 03/26/20 at [place]: [place]  Abdominal Pain   HISTORY OF PRESENT ILLNESS  03/26/20 11:57 PM Deanna Allen is a 69 y.o. female who has had some left flank discomfort off and on for several days but not persistent and not severe.  She is here with left lower quadrant abdominal pain since this morning that has been persistent.  She rates the pain is severe (10 out of 10), sharp in nature, and not worse with movement or palpation.  She has had associated nausea and vomiting.  She has not noted hematuria.  She has not had diarrhea.   Past Medical History:  Diagnosis Date   Anxiety    Arthritis    RIGHT knee, bone on bone   Cancer (HCC)    basal cell CA-face   Family history of adverse reaction to anesthesia    sister n&v   Hepatitis    as a baby   Hyperlipidemia    Left ankle injury 12/11/2012    Past Surgical History:  Procedure Laterality Date   APPENDECTOMY     BREAST LUMPECTOMY WITH RADIOACTIVE SEED LOCALIZATION Right 01/11/2016   Procedure: RIGHT BREAST LUMPECTOMY WITH RADIOACTIVE SEED LOCALIZATION;  Surgeon: Coralie Keens, MD;  Location: [place];  Service: General;  Laterality: Right;  RIGHT BREAST LUMPECTOMY WITH RADIOACTIVE SEED LOCALIZATION   COLONOSCOPY     GANGLION CYST EXCISION Right    wrist   KNEE SURGERY  2001   right arthroscopy   TOTAL KNEE ARTHROPLASTY Right 11/06/2016   TOTAL KNEE ARTHROPLASTY Right 11/06/2016   Procedure: RIGHT TOTAL KNEE ARTHROPLASTY;  Surgeon: Vickey Huger, MD;  Location: [place] [facility];  Service: Orthopedics;  Laterality: Right;    Family History  Problem Relation Age of Onset   Colon cancer Father 58   Leukemia Father    Heart attack Father    Hyperlipidemia Father    Hypertension Father    Colon cancer Maternal Grandmother        DX  AGE 79'S   Alcohol abuse  Maternal Grandmother    Muscular dystrophy Mother 27   Alcohol abuse Maternal Grandfather    Emphysema Maternal Grandfather    Asthma Son    Esophageal cancer Neg Hx    Stomach cancer Neg Hx    Rectal cancer Neg Hx    Diabetes Neg Hx    Sudden death Neg Hx     Social History   Tobacco Use   Smoking status: Never Smoker   Smokeless tobacco: Never Used  Vaping Use   Vaping Use: Never used  Substance Use Topics   Alcohol use: Yes    Comment: 2-3 a month   Drug use: No    Prior to Admission medications   Medication Sig Start Date End Date Taking? Authorizing Provider  aspirin 81 MG tablet Take 81 mg by mouth every other day.    [provider]  Biotin 5000 MCG TABS Take by mouth.    [provider]  calcium carbonate (OS-CAL) 600 MG TABS Take 600 mg by mouth 2 (two) times daily with a meal.    [provider]  Cholecalciferol (VITAMIN D-3) 5000 units TABS Take 5,000 mg by mouth daily.    [provider]  Coenzyme Q10 (CO Q 10 PO) Take 1 capsule by mouth daily.    [provider]  ezetimibe (ZETIA)  10 MG tablet Take 1 tablet (10 mg total) by mouth daily. 12/13/18   Rutherford Guys, MD  Glucosamine HCl ([employer] GLUCOSAMINE) 1500 MG TABS Take 1,500 mg by mouth 2 (two) times daily.    [provider]  Javier Docker Oil 500 MG CAPS Take 500 mg by mouth daily.    [provider]  LORazepam (ATIVAN) 0.5 MG tablet Take 1 tablet (0.5 mg total) by mouth daily as needed for anxiety. 12/13/18   Rutherford Guys, MD  Multiple Vitamin (MULTIVITAMIN WITH MINERALS) TABS tablet Take 1 tablet by mouth daily.    [provider]  ondansetron (ZOFRAN ODT) 8 MG disintegrating tablet Take 1 tablet (8 mg total) by mouth every 8 (eight) hours as needed for nausea or vomiting. 03/27/20   Kaislyn Gulas, MD  oxyCODONE-acetaminophen (PERCOCET) 10-325 MG tablet Take 1 tablet by mouth every 6 (six) hours as needed for pain. 03/27/20   Jowana Thumma,  Ronesha Heenan, MD  sertraline (ZOLOFT) 25 MG tablet Take 1 tablet (25 mg total) by mouth daily. 12/13/18   Rutherford Guys, MD  tamsulosin (FLOMAX) 0.4 MG CAPS capsule Take 1 capsule daily until stone passes. 03/27/20   Suriah Peragine, MD    Allergies No known allergies   REVIEW OF SYSTEMS  Negative except as noted here or in the History of Present Illness.   PHYSICAL EXAMINATION  Initial Vital Signs Blood pressure (!) 175/88, pulse 93, temperature 98.9 F (37.2 C), temperature source Oral, resp. rate 20, height 5\' 2"  (1.575 m), weight 99.8 kg, SpO2 97 %.  Examination General: Well-developed, well-nourished female in no acute distress; appearance consistent with age of record HENT: normocephalic; atraumatic Eyes: pupils equal, round and reactive to light; extraocular muscles intact Neck: supple Heart: regular rate and rhythm; no murmurs, rubs or gallops Lungs: clear to auscultation bilaterally Abdomen: soft; nondistended; nontender; bowel sounds present GU: No CVA tenderness Extremities: No deformity; full range of motion; pulses normal Neurologic: Awake, alert and oriented; motor function intact in all extremities and symmetric; no facial droop Skin: Warm and dry Psychiatric: Grimacing   RESULTS  Summary of this visit's results, reviewed and interpreted by myself:   EKG Interpretation  Date/Time:    Ventricular Rate:    PR Interval:    QRS Duration:   QT Interval:    QTC Calculation:   R Axis:     Text Interpretation:        Laboratory Studies: Results for orders placed or performed during the hospital encounter of 03/26/20 (from the past 24 hour(s))  Urinalysis, Routine w reflex microscopic     Status: Abnormal   Collection Time: 03/26/20  7:50 PM  Result Value Ref Range   Color, Urine YELLOW YELLOW   APPearance HAZY (A) CLEAR   Specific Gravity, Urine 1.025 1.005 - 1.030   pH 6.0 5.0 - 8.0   Glucose, UA NEGATIVE NEGATIVE mg/dL   Hgb urine dipstick LARGE (A) NEGATIVE     Bilirubin Urine NEGATIVE NEGATIVE   Ketones, ur >80 (A) NEGATIVE mg/dL   Protein, ur NEGATIVE NEGATIVE mg/dL   Nitrite NEGATIVE NEGATIVE   Leukocytes,Ua TRACE (A) NEGATIVE  Urinalysis, Microscopic (reflex)     Status: Abnormal   Collection Time: 03/26/20  7:50 PM  Result Value Ref Range   RBC / HPF >50 0 - 5 RBC/hpf   WBC, UA 0-5 0 - 5 WBC/hpf   Bacteria, UA MANY (A) NONE SEEN   Squamous Epithelial / LPF 0-5 0 - 5  Lipase,  blood     Status: None   Collection Time: 03/26/20  8:06 PM  Result Value Ref Range   Lipase 31 11 - 51 U/L  Comprehensive metabolic panel     Status: Abnormal   Collection Time: 03/26/20  8:06 PM  Result Value Ref Range   Sodium 142 135 - 145 mmol/L   Potassium 4.2 3.5 - 5.1 mmol/L   Chloride 105 98 - 111 mmol/L   CO2 25 22 - 32 mmol/L   Glucose, Bld 144 (H) 70 - 99 mg/dL   BUN 22 8 - 23 mg/dL   Creatinine, Ser 1.46 (H) 0.44 - 1.00 mg/dL   Calcium 9.6 8.9 - 10.3 mg/dL   Total Protein 8.0 6.5 - 8.1 g/dL   Albumin 4.4 3.5 - 5.0 g/dL   AST 28 15 - 41 U/L   ALT 21 0 - 44 U/L   Alkaline Phosphatase 100 38 - 126 U/L   Total Bilirubin 1.0 0.3 - 1.2 mg/dL   GFR calc non Af Amer 36 (L) >60 mL/min   GFR calc Af Amer 42 (L) >60 mL/min   Anion gap 12 5 - 15  CBC     Status: Abnormal   Collection Time: 03/26/20  8:06 PM  Result Value Ref Range   WBC 13.1 (H) 4.0 - 10.5 K/uL   RBC 4.96 3.87 - 5.11 MIL/uL   Hemoglobin 15.3 (H) 12.0 - 15.0 g/dL   HCT 44.5 36 - 46 %   MCV 89.7 80.0 - 100.0 fL   MCH 30.8 26.0 - 34.0 pg   MCHC 34.4 30.0 - 36.0 g/dL   RDW 12.5 11.5 - 15.5 %   Platelets 207 150 - 400 K/uL   nRBC 0.0 0.0 - 0.2 %   Imaging Studies: CT ABDOMEN PELVIS W CONTRAST  Result Date: 03/26/2020 CLINICAL DATA:  Low abdominal pain with nausea and vomiting EXAM: CT ABDOMEN AND PELVIS WITH CONTRAST TECHNIQUE: Multidetector CT imaging of the abdomen and pelvis was performed using the standard protocol following bolus administration of intravenous contrast.  CONTRAST:  14mL OMNIPAQUE IOHEXOL 300 MG/ML  SOLN COMPARISON:  None. FINDINGS: Lower chest: Lung bases demonstrate no acute consolidation or effusion. Cardiac size within normal limits. Hepatobiliary: No focal liver abnormality is seen. No gallstones, gallbladder wall thickening, or biliary dilatation. Pancreas: Unremarkable. No pancreatic ductal dilatation or surrounding inflammatory changes. Spleen: Normal in size without focal abnormality. Adrenals/Urinary Tract: Adrenal glands are normal. There are parapelvic cysts on the right. Moderate left hydronephrosis and hydroureter, secondary to a distal ureteral stone measuring 5 mm that is less than a cm proximal to the left UVJ. Slightly cranial to this is a rounded calcification measuring 2 mm either within or just anterior to the ureter, series 2, image number 74. The bladder is unremarkable Stomach/Bowel: Stomach is within normal limits. Appendix not well seen but no right lower quadrant inflammatory process. No evidence of bowel wall thickening, distention, or inflammatory changes. Vascular/Lymphatic: Mild aortic atherosclerosis. No aneurysm. No suspicious nodes. Reproductive: Uterus and bilateral adnexa are unremarkable. Other: Negative for free air or free fluid Musculoskeletal: None IMPRESSION: 1. Moderate left hydronephrosis and hydroureter, secondary to a 5 mm distal ureteral stone that is less than a cm proximal to the left UVJ. Possible additional small stone in the distal left ureter about a cm cranial to the aforementioned stone. 2. There are no other acute abnormalities visualized. Aortic Atherosclerosis (ICD10-I70.0). Electronically Signed   By: Donavan Foil M.D.   On: 03/26/2020  22:16    ED COURSE and MDM  Nursing notes, initial and subsequent vitals signs, including pulse oximetry, reviewed and interpreted by myself.  Vitals:   03/26/20 1943 03/26/20 1945 03/26/20 2354  BP: (!) 179/78  (!) 175/88  Pulse: 78  93  Resp: 20  20  Temp: 98.1 F  (36.7 C)  98.9 F (37.2 C)  TempSrc: Oral  Oral  SpO2:   97%  Weight:  99.8 kg   Height:  5\' 2"  (1.575 m)    Medications  oxyCODONE-acetaminophen (PERCOCET/ROXICET) 5-325 MG per tablet 1 tablet (has no administration in time range)  fentaNYL (SUBLIMAZE) injection 50 mcg (50 mcg Intravenous Given 03/26/20 2207)  ondansetron (ZOFRAN) injection 4 mg (4 mg Intravenous Given 03/26/20 2005)  iohexol (OMNIPAQUE) 300 MG/ML solution 80 mL (80 mLs Intravenous Contrast Given 03/26/20 2148)  fentaNYL (SUBLIMAZE) injection 100 mcg (100 mcg Intravenous Given 03/27/20 0004)  tamsulosin (FLOMAX) capsule 0.4 mg (0.4 mg Oral Given 03/27/20 0013)   1:57 AM Pain well controlled at this time.  Will discharge patient home.  Urine sent for culture but she has no leukocytosis in her urine.  She was advised to return for fever or chills.  Will refer to urology as the stone may require urologic intervention given its size.  PROCEDURES  Procedures   ED DIAGNOSES     ICD-10-CM   1. Ureterolithiasis  N20.1        Mithran Strike, MD 03/27/20 [phone]

## 2020-03-26 NOTE — ED Triage Notes (Signed)
Pt c/o low abd pain with N/V that started this AM. Pt denies diarrhea. Pt has not been able to pass gas.

## 2020-03-26 NOTE — ED Triage Notes (Signed)
Note pt was triaged previously on arrival, but due to an error with the registration process all triage notes were lost.

## 2020-03-27 MED ORDER — OXYCODONE-ACETAMINOPHEN 5-325 MG PO TABS
1.0000 | ORAL_TABLET | Freq: Once | ORAL | Status: AC
  Administered 2020-03-27: 1 via ORAL
  Filled 2020-03-27: qty 1

## 2020-03-27 MED ORDER — TAMSULOSIN HCL 0.4 MG PO CAPS: ORAL_CAPSULE | ORAL | 1 refills | Status: AC

## 2020-03-27 MED ORDER — TAMSULOSIN HCL 0.4 MG PO CAPS
0.4000 mg | ORAL_CAPSULE | Freq: Once | ORAL | Status: AC
  Administered 2020-03-27: 0.4 mg via ORAL
  Filled 2020-03-27: qty 1

## 2020-03-27 MED ORDER — FENTANYL CITRATE (PF) 100 MCG/2ML IJ SOLN
100.0000 ug | Freq: Once | INTRAMUSCULAR | Status: AC
  Administered 2020-03-27: 100 ug via INTRAVENOUS
  Filled 2020-03-27: qty 2

## 2020-03-27 MED ORDER — OXYCODONE-ACETAMINOPHEN 10-325 MG PO TABS: 1.0000 | ORAL_TABLET | Freq: Four times a day (QID) | ORAL | 0 refills | Status: AC | PRN

## 2020-03-27 MED ORDER — ONDANSETRON 8 MG PO TBDP: 8.0000 mg | ORAL_TABLET | Freq: Three times a day (TID) | ORAL | 1 refills | Status: AC | PRN

## 2020-03-28 LAB — URINE CULTURE: Culture: <10000 — AB

## 2020-03-31 DIAGNOSIS — N201 Calculus of ureter: Secondary | ICD-10-CM | POA: Diagnosis not present

## 2020-04-02 ENCOUNTER — Encounter (HOSPITAL_BASED_OUTPATIENT_CLINIC_OR_DEPARTMENT_OTHER): Payer: Self-pay

## 2020-05-03 DIAGNOSIS — Z23 Encounter for immunization: Secondary | ICD-10-CM | POA: Diagnosis not present

## 2020-06-07 DIAGNOSIS — R0981 Nasal congestion: Secondary | ICD-10-CM | POA: Diagnosis not present

## 2020-06-07 DIAGNOSIS — R059 Cough, unspecified: Secondary | ICD-10-CM | POA: Diagnosis not present

## 2020-06-07 DIAGNOSIS — R519 Headache, unspecified: Secondary | ICD-10-CM | POA: Diagnosis not present

## 2020-07-19 DIAGNOSIS — Z03818 Encounter for observation for suspected exposure to other biological agents ruled out: Secondary | ICD-10-CM | POA: Diagnosis not present

## 2020-07-19 DIAGNOSIS — J40 Bronchitis, not specified as acute or chronic: Secondary | ICD-10-CM | POA: Diagnosis not present

## 2020-07-19 DIAGNOSIS — R059 Cough, unspecified: Secondary | ICD-10-CM | POA: Diagnosis not present

## 2020-07-19 DIAGNOSIS — J019 Acute sinusitis, unspecified: Secondary | ICD-10-CM | POA: Diagnosis not present

## 2020-07-19 DIAGNOSIS — R509 Fever, unspecified: Secondary | ICD-10-CM | POA: Diagnosis not present

## 2020-08-17 DIAGNOSIS — Z1231 Encounter for screening mammogram for malignant neoplasm of breast: Secondary | ICD-10-CM | POA: Diagnosis not present

## 2020-08-24 DIAGNOSIS — E785 Hyperlipidemia, unspecified: Secondary | ICD-10-CM | POA: Diagnosis not present

## 2020-08-24 DIAGNOSIS — R69 Illness, unspecified: Secondary | ICD-10-CM | POA: Diagnosis not present

## 2020-08-24 DIAGNOSIS — R030 Elevated blood-pressure reading, without diagnosis of hypertension: Secondary | ICD-10-CM | POA: Diagnosis not present

## 2021-01-27 DIAGNOSIS — H35371 Puckering of macula, right eye: Secondary | ICD-10-CM | POA: Diagnosis not present

## 2021-02-23 DIAGNOSIS — M25532 Pain in left wrist: Secondary | ICD-10-CM | POA: Diagnosis not present

## 2021-02-23 DIAGNOSIS — R030 Elevated blood-pressure reading, without diagnosis of hypertension: Secondary | ICD-10-CM | POA: Diagnosis not present

## 2021-02-23 DIAGNOSIS — M25531 Pain in right wrist: Secondary | ICD-10-CM | POA: Diagnosis not present

## 2021-02-23 DIAGNOSIS — E785 Hyperlipidemia, unspecified: Secondary | ICD-10-CM | POA: Diagnosis not present

## 2021-02-23 DIAGNOSIS — R69 Illness, unspecified: Secondary | ICD-10-CM | POA: Diagnosis not present

## 2021-02-28 DIAGNOSIS — M25531 Pain in right wrist: Secondary | ICD-10-CM | POA: Diagnosis not present

## 2021-03-22 DIAGNOSIS — H25043 Posterior subcapsular polar age-related cataract, bilateral: Secondary | ICD-10-CM | POA: Diagnosis not present

## 2021-03-22 DIAGNOSIS — H2513 Age-related nuclear cataract, bilateral: Secondary | ICD-10-CM | POA: Diagnosis not present

## 2021-03-22 DIAGNOSIS — H25013 Cortical age-related cataract, bilateral: Secondary | ICD-10-CM | POA: Diagnosis not present

## 2021-03-22 DIAGNOSIS — H18413 Arcus senilis, bilateral: Secondary | ICD-10-CM | POA: Diagnosis not present

## 2021-03-22 DIAGNOSIS — H2511 Age-related nuclear cataract, right eye: Secondary | ICD-10-CM | POA: Diagnosis not present

## 2021-05-27 DIAGNOSIS — H2511 Age-related nuclear cataract, right eye: Secondary | ICD-10-CM | POA: Diagnosis not present

## 2021-07-10 DIAGNOSIS — H6123 Impacted cerumen, bilateral: Secondary | ICD-10-CM | POA: Diagnosis not present

## 2021-07-10 DIAGNOSIS — H60503 Unspecified acute noninfective otitis externa, bilateral: Secondary | ICD-10-CM | POA: Diagnosis not present

## 2021-08-23 DIAGNOSIS — Z1231 Encounter for screening mammogram for malignant neoplasm of breast: Secondary | ICD-10-CM | POA: Diagnosis not present

## 2021-10-04 DIAGNOSIS — H35351 Cystoid macular degeneration, right eye: Secondary | ICD-10-CM | POA: Diagnosis not present

## 2021-10-04 DIAGNOSIS — H18413 Arcus senilis, bilateral: Secondary | ICD-10-CM | POA: Diagnosis not present

## 2021-10-04 DIAGNOSIS — Z961 Presence of intraocular lens: Secondary | ICD-10-CM | POA: Diagnosis not present

## 2021-10-04 DIAGNOSIS — H2512 Age-related nuclear cataract, left eye: Secondary | ICD-10-CM | POA: Diagnosis not present

## 2021-10-12 DIAGNOSIS — H2512 Age-related nuclear cataract, left eye: Secondary | ICD-10-CM | POA: Diagnosis not present

## 2021-11-14 DIAGNOSIS — D225 Melanocytic nevi of trunk: Secondary | ICD-10-CM | POA: Diagnosis not present

## 2021-11-14 DIAGNOSIS — L820 Inflamed seborrheic keratosis: Secondary | ICD-10-CM | POA: Diagnosis not present

## 2021-11-14 DIAGNOSIS — D1801 Hemangioma of skin and subcutaneous tissue: Secondary | ICD-10-CM | POA: Diagnosis not present

## 2021-11-14 DIAGNOSIS — L918 Other hypertrophic disorders of the skin: Secondary | ICD-10-CM | POA: Diagnosis not present

## 2021-11-14 DIAGNOSIS — L570 Actinic keratosis: Secondary | ICD-10-CM | POA: Diagnosis not present

## 2021-11-14 DIAGNOSIS — Z85828 Personal history of other malignant neoplasm of skin: Secondary | ICD-10-CM | POA: Diagnosis not present

## 2021-11-14 DIAGNOSIS — L821 Other seborrheic keratosis: Secondary | ICD-10-CM | POA: Diagnosis not present

## 2021-11-15 DIAGNOSIS — H35351 Cystoid macular degeneration, right eye: Secondary | ICD-10-CM | POA: Diagnosis not present

## 2021-12-15 DIAGNOSIS — Z0100 Encounter for examination of eyes and vision without abnormal findings: Secondary | ICD-10-CM | POA: Diagnosis not present

## 2021-12-16 DIAGNOSIS — H43813 Vitreous degeneration, bilateral: Secondary | ICD-10-CM | POA: Diagnosis not present

## 2021-12-16 DIAGNOSIS — H30043 Focal chorioretinal inflammation, macular or paramacular, bilateral: Secondary | ICD-10-CM | POA: Diagnosis not present

## 2021-12-16 DIAGNOSIS — H26493 Other secondary cataract, bilateral: Secondary | ICD-10-CM | POA: Diagnosis not present

## 2021-12-16 DIAGNOSIS — H59033 Cystoid macular edema following cataract surgery, bilateral: Secondary | ICD-10-CM | POA: Diagnosis not present

## 2021-12-16 DIAGNOSIS — H35373 Puckering of macula, bilateral: Secondary | ICD-10-CM | POA: Diagnosis not present

## 2021-12-22 DIAGNOSIS — H59033 Cystoid macular edema following cataract surgery, bilateral: Secondary | ICD-10-CM | POA: Diagnosis not present

## 2021-12-22 DIAGNOSIS — H30043 Focal chorioretinal inflammation, macular or paramacular, bilateral: Secondary | ICD-10-CM | POA: Diagnosis not present

## 2022-01-31 DIAGNOSIS — H26493 Other secondary cataract, bilateral: Secondary | ICD-10-CM | POA: Diagnosis not present

## 2022-01-31 DIAGNOSIS — H43813 Vitreous degeneration, bilateral: Secondary | ICD-10-CM | POA: Diagnosis not present

## 2022-01-31 DIAGNOSIS — H35373 Puckering of macula, bilateral: Secondary | ICD-10-CM | POA: Diagnosis not present

## 2022-02-28 DIAGNOSIS — H35373 Puckering of macula, bilateral: Secondary | ICD-10-CM | POA: Diagnosis not present

## 2022-03-02 DIAGNOSIS — M1712 Unilateral primary osteoarthritis, left knee: Secondary | ICD-10-CM | POA: Diagnosis not present

## 2022-03-02 DIAGNOSIS — M25562 Pain in left knee: Secondary | ICD-10-CM | POA: Diagnosis not present

## 2022-03-02 DIAGNOSIS — G8929 Other chronic pain: Secondary | ICD-10-CM | POA: Diagnosis not present

## 2022-03-02 DIAGNOSIS — Z96651 Presence of right artificial knee joint: Secondary | ICD-10-CM | POA: Diagnosis not present

## 2022-04-10 DIAGNOSIS — E2839 Other primary ovarian failure: Secondary | ICD-10-CM | POA: Diagnosis not present

## 2022-04-10 DIAGNOSIS — Z6841 Body Mass Index (BMI) 40.0 and over, adult: Secondary | ICD-10-CM | POA: Diagnosis not present

## 2022-04-10 DIAGNOSIS — Z23 Encounter for immunization: Secondary | ICD-10-CM | POA: Diagnosis not present

## 2022-04-10 DIAGNOSIS — E785 Hyperlipidemia, unspecified: Secondary | ICD-10-CM | POA: Diagnosis not present

## 2022-04-10 DIAGNOSIS — H6120 Impacted cerumen, unspecified ear: Secondary | ICD-10-CM | POA: Diagnosis not present

## 2022-04-10 DIAGNOSIS — Z1211 Encounter for screening for malignant neoplasm of colon: Secondary | ICD-10-CM | POA: Diagnosis not present

## 2022-04-10 DIAGNOSIS — Z0000 Encounter for general adult medical examination without abnormal findings: Secondary | ICD-10-CM | POA: Diagnosis not present

## 2022-04-10 DIAGNOSIS — R69 Illness, unspecified: Secondary | ICD-10-CM | POA: Diagnosis not present

## 2022-04-10 DIAGNOSIS — R030 Elevated blood-pressure reading, without diagnosis of hypertension: Secondary | ICD-10-CM | POA: Diagnosis not present

## 2022-04-11 DIAGNOSIS — H35373 Puckering of macula, bilateral: Secondary | ICD-10-CM | POA: Diagnosis not present

## 2022-04-11 DIAGNOSIS — H43813 Vitreous degeneration, bilateral: Secondary | ICD-10-CM | POA: Diagnosis not present

## 2022-04-11 DIAGNOSIS — H26493 Other secondary cataract, bilateral: Secondary | ICD-10-CM | POA: Diagnosis not present

## 2022-04-11 DIAGNOSIS — H59031 Cystoid macular edema following cataract surgery, right eye: Secondary | ICD-10-CM | POA: Diagnosis not present

## 2022-04-12 DIAGNOSIS — L570 Actinic keratosis: Secondary | ICD-10-CM | POA: Diagnosis not present

## 2022-04-12 DIAGNOSIS — L718 Other rosacea: Secondary | ICD-10-CM | POA: Diagnosis not present

## 2022-04-12 DIAGNOSIS — L820 Inflamed seborrheic keratosis: Secondary | ICD-10-CM | POA: Diagnosis not present

## 2022-04-12 DIAGNOSIS — L578 Other skin changes due to chronic exposure to nonionizing radiation: Secondary | ICD-10-CM | POA: Diagnosis not present

## 2022-04-13 DIAGNOSIS — H35371 Puckering of macula, right eye: Secondary | ICD-10-CM | POA: Diagnosis not present

## 2022-04-13 DIAGNOSIS — H3581 Retinal edema: Secondary | ICD-10-CM | POA: Diagnosis not present

## 2022-04-13 DIAGNOSIS — H26491 Other secondary cataract, right eye: Secondary | ICD-10-CM | POA: Diagnosis not present

## 2022-04-19 DIAGNOSIS — E785 Hyperlipidemia, unspecified: Secondary | ICD-10-CM | POA: Diagnosis not present

## 2022-04-19 DIAGNOSIS — Z1211 Encounter for screening for malignant neoplasm of colon: Secondary | ICD-10-CM | POA: Diagnosis not present

## 2022-04-21 DIAGNOSIS — H35373 Puckering of macula, bilateral: Secondary | ICD-10-CM | POA: Diagnosis not present

## 2022-04-21 DIAGNOSIS — H59031 Cystoid macular edema following cataract surgery, right eye: Secondary | ICD-10-CM | POA: Diagnosis not present

## 2022-05-18 DIAGNOSIS — M1712 Unilateral primary osteoarthritis, left knee: Secondary | ICD-10-CM | POA: Diagnosis not present

## 2022-05-19 DIAGNOSIS — H35373 Puckering of macula, bilateral: Secondary | ICD-10-CM | POA: Diagnosis not present

## 2022-06-01 DIAGNOSIS — M1712 Unilateral primary osteoarthritis, left knee: Secondary | ICD-10-CM | POA: Diagnosis not present

## 2022-07-03 IMAGING — CT CT ABD-PELV W/ CM
2 of 5 series · 16 of 46 positions shown, 18 images · IV contrast (omnipaque)
Comparison: None.

CLINICAL DATA: Low abdominal pain with nausea and vomiting

EXAM:
CT ABDOMEN AND PELVIS WITH CONTRAST
TECHNIQUE: Multidetector CT imaging of the abdomen and pelvis was performed
using the standard protocol following bolus administration of
intravenous contrast.
CONTRAST:  80mL OMNIPAQUE IOHEXOL 300 MG/ML  SOLN

[Series 2: axial st · axial · 0.81mm/px · z∈[+890,+1294]mm · 13 of 93 slices shown, 15 images]
[im 6/93  soft-tissue]
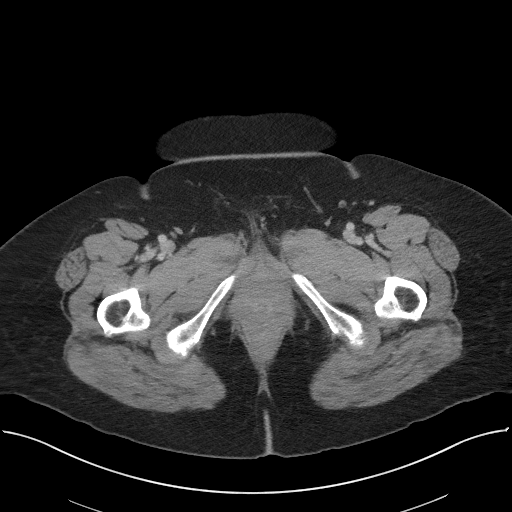
[im 6/93  bone]
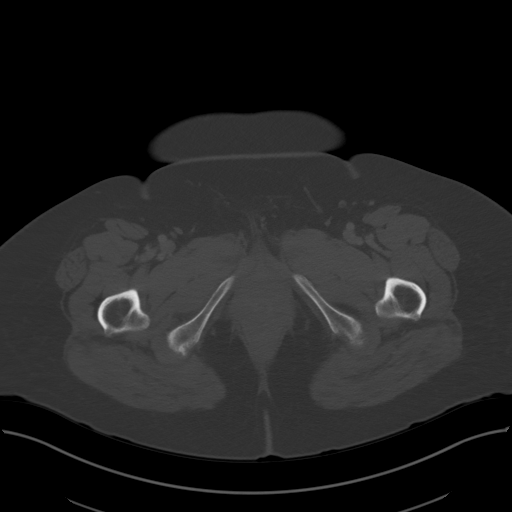
[im 11/93  soft-tissue]
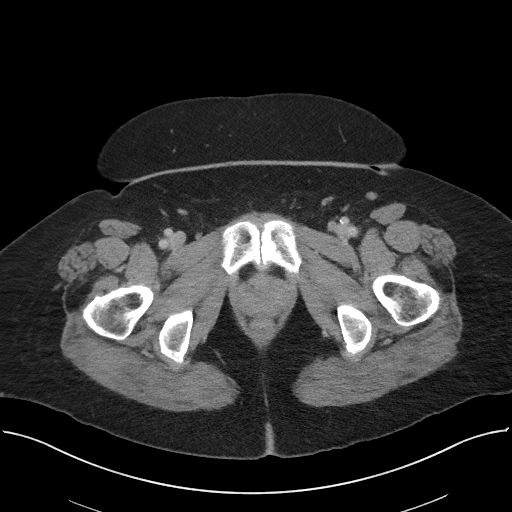
[im 21/93  soft-tissue]
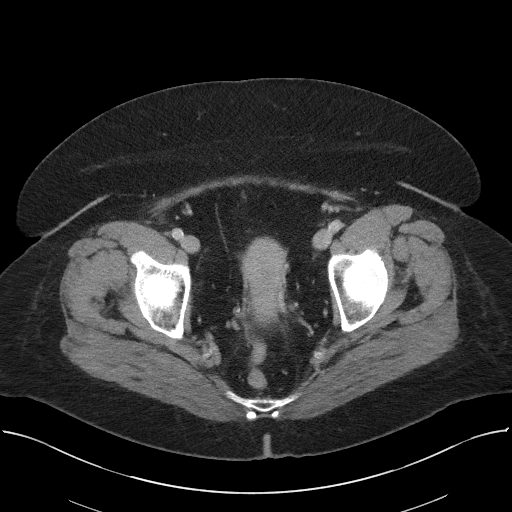
[im 26/93  soft-tissue]
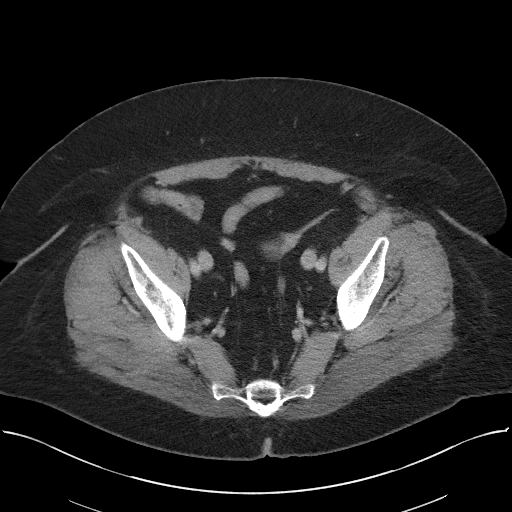
[im 31/93  soft-tissue]
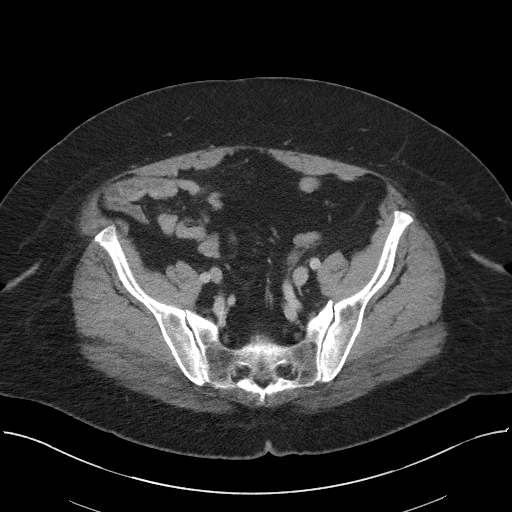
[im 41/93  soft-tissue]
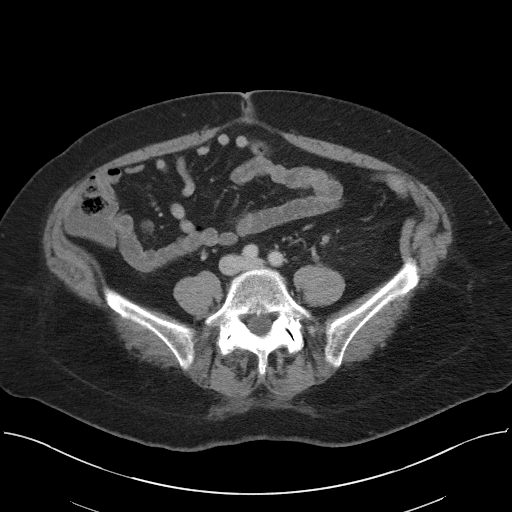
[im 47/93  soft-tissue]
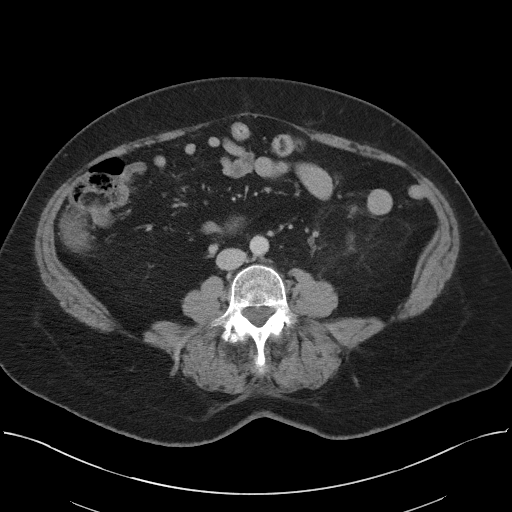
[im 52/93  soft-tissue]
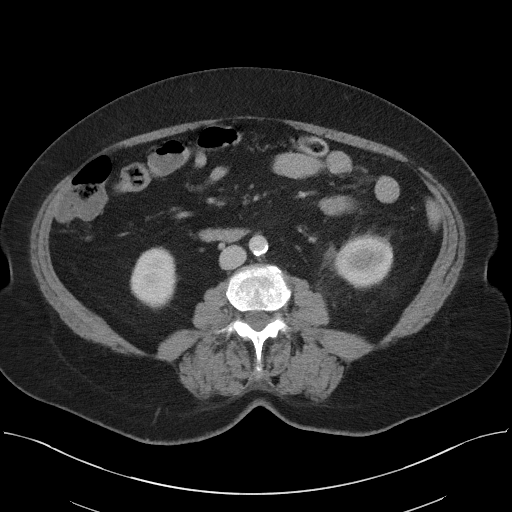
[im 62/93  soft-tissue]
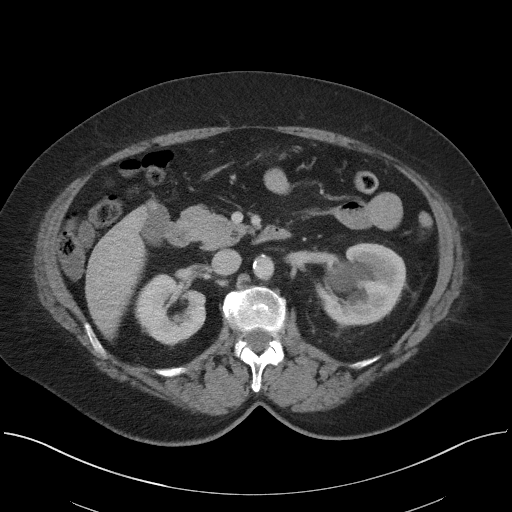
[im 62/93  bone]
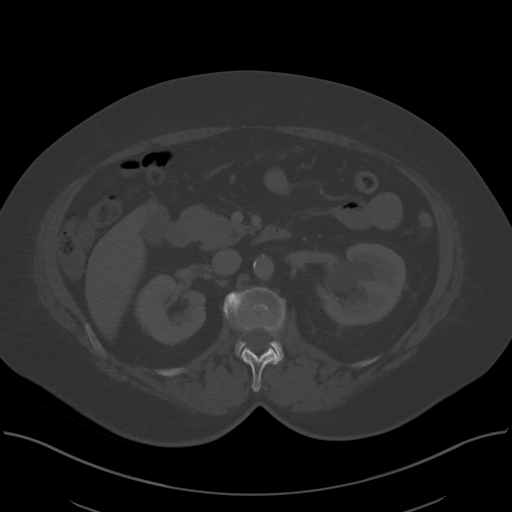
[im 67/93  soft-tissue]
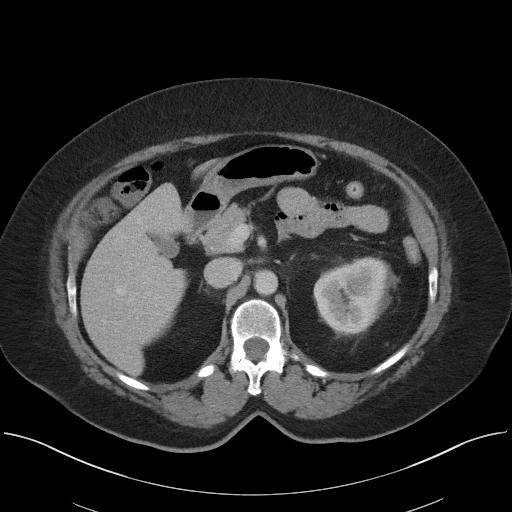
[im 72/93  soft-tissue]
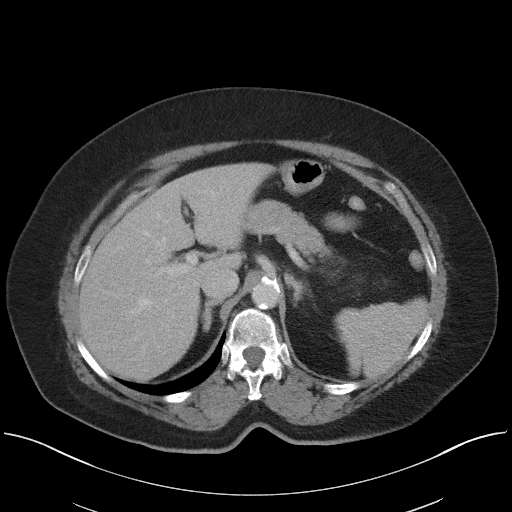
[im 82/93  soft-tissue]
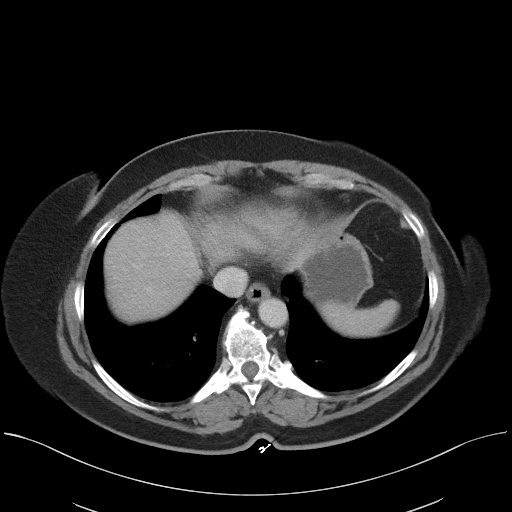
[im 87/93  soft-tissue]
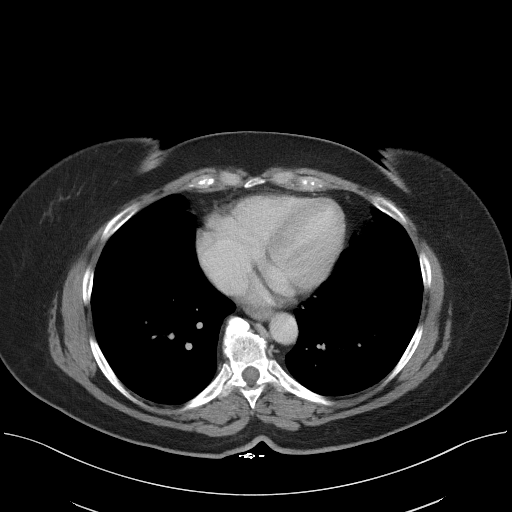

[Series 5: coronal st · coronal · 0.84mm/px · 3 of 98 slices shown]
[im 33/98  soft-tissue]
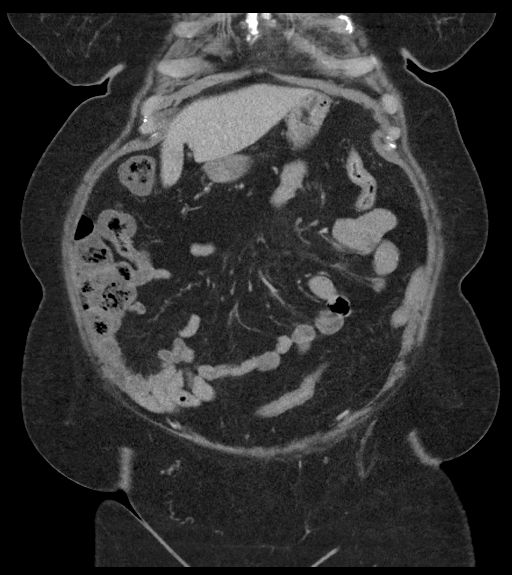
[im 44/98  soft-tissue]
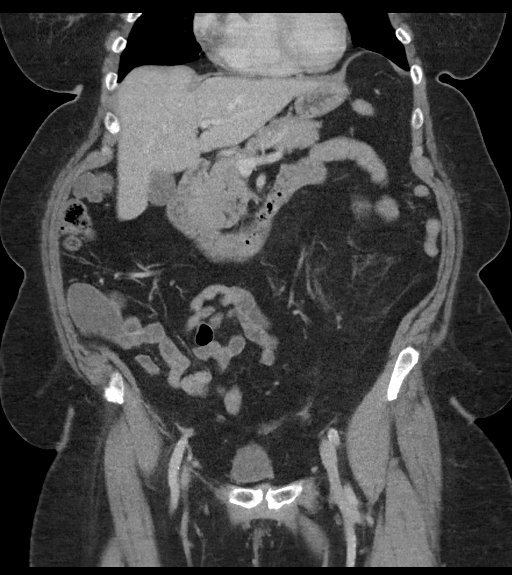
[im 54/98  soft-tissue]
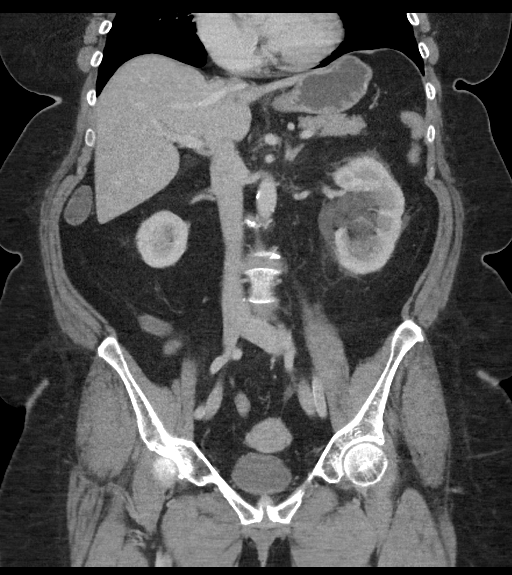

[16 of 46 positions shown; findings below may reference images not displayed]

FINDINGS: Lower chest: Lung bases demonstrate no acute consolidation or
effusion. Cardiac size within normal limits.

Hepatobiliary: No focal liver abnormality is seen. No gallstones,
gallbladder wall thickening, or biliary dilatation.

Pancreas: Unremarkable. No pancreatic ductal dilatation or
surrounding inflammatory changes.

Spleen: Normal in size without focal abnormality.

Adrenals/Urinary Tract: Adrenal glands are normal. There are
parapelvic cysts on the right. Moderate left hydronephrosis and
hydroureter, secondary to a distal ureteral stone measuring 5 mm
that is less than a cm proximal to the left UVJ. Slightly cranial to
this is a rounded calcification measuring 2 mm either within or just
anterior to the ureter, series 2, image number 74. The bladder is
unremarkable

Stomach/Bowel: Stomach is within normal limits. Appendix not well
seen but no right lower quadrant inflammatory process. No evidence
of bowel wall thickening, distention, or inflammatory changes.

Vascular/Lymphatic: Mild aortic atherosclerosis. No aneurysm. No
suspicious nodes.

Reproductive: Uterus and bilateral adnexa are unremarkable.

Other: Negative for free air or free fluid

Musculoskeletal: None
IMPRESSION: 1. Moderate left hydronephrosis and hydroureter, secondary to a 5 mm
distal ureteral stone that is less than a cm proximal to the left
UVJ. Possible additional small stone in the distal left ureter about
a cm cranial to the aforementioned stone.
2. There are no other acute abnormalities visualized.

Aortic Atherosclerosis ([id number]).

## 2022-07-11 DIAGNOSIS — M1712 Unilateral primary osteoarthritis, left knee: Secondary | ICD-10-CM | POA: Diagnosis not present

## 2022-07-25 DIAGNOSIS — Z961 Presence of intraocular lens: Secondary | ICD-10-CM | POA: Diagnosis not present

## 2022-07-25 DIAGNOSIS — H04123 Dry eye syndrome of bilateral lacrimal glands: Secondary | ICD-10-CM | POA: Diagnosis not present

## 2022-07-25 DIAGNOSIS — H26492 Other secondary cataract, left eye: Secondary | ICD-10-CM | POA: Diagnosis not present

## 2022-07-25 DIAGNOSIS — H35372 Puckering of macula, left eye: Secondary | ICD-10-CM | POA: Diagnosis not present

## 2022-07-25 DIAGNOSIS — H43812 Vitreous degeneration, left eye: Secondary | ICD-10-CM | POA: Diagnosis not present

## 2022-07-26 DIAGNOSIS — E785 Hyperlipidemia, unspecified: Secondary | ICD-10-CM | POA: Diagnosis not present

## 2022-07-28 DIAGNOSIS — Z6841 Body Mass Index (BMI) 40.0 and over, adult: Secondary | ICD-10-CM | POA: Diagnosis not present

## 2022-07-28 DIAGNOSIS — M1712 Unilateral primary osteoarthritis, left knee: Secondary | ICD-10-CM | POA: Diagnosis not present

## 2022-07-28 DIAGNOSIS — Z01818 Encounter for other preprocedural examination: Secondary | ICD-10-CM | POA: Diagnosis not present

## 2022-08-29 DIAGNOSIS — Z1231 Encounter for screening mammogram for malignant neoplasm of breast: Secondary | ICD-10-CM | POA: Diagnosis not present

## 2022-08-29 DIAGNOSIS — Z419 Encounter for procedure for purposes other than remedying health state, unspecified: Secondary | ICD-10-CM | POA: Diagnosis not present

## 2022-09-05 DIAGNOSIS — M810 Age-related osteoporosis without current pathological fracture: Secondary | ICD-10-CM | POA: Diagnosis not present

## 2022-09-05 DIAGNOSIS — Z780 Asymptomatic menopausal state: Secondary | ICD-10-CM | POA: Diagnosis not present

## 2022-09-05 DIAGNOSIS — M85851 Other specified disorders of bone density and structure, right thigh: Secondary | ICD-10-CM | POA: Diagnosis not present

## 2022-09-20 DIAGNOSIS — Z96652 Presence of left artificial knee joint: Secondary | ICD-10-CM | POA: Diagnosis not present

## 2022-09-20 DIAGNOSIS — M1712 Unilateral primary osteoarthritis, left knee: Secondary | ICD-10-CM | POA: Diagnosis not present

## 2022-09-20 DIAGNOSIS — G8918 Other acute postprocedural pain: Secondary | ICD-10-CM | POA: Diagnosis not present

## 2022-09-28 DIAGNOSIS — R29898 Other symptoms and signs involving the musculoskeletal system: Secondary | ICD-10-CM | POA: Diagnosis not present

## 2022-09-28 DIAGNOSIS — M25462 Effusion, left knee: Secondary | ICD-10-CM | POA: Diagnosis not present

## 2022-09-28 DIAGNOSIS — M25662 Stiffness of left knee, not elsewhere classified: Secondary | ICD-10-CM | POA: Diagnosis not present

## 2022-09-28 DIAGNOSIS — M25562 Pain in left knee: Secondary | ICD-10-CM | POA: Diagnosis not present

## 2022-09-28 DIAGNOSIS — G8929 Other chronic pain: Secondary | ICD-10-CM | POA: Diagnosis not present

## 2022-09-28 DIAGNOSIS — Z7409 Other reduced mobility: Secondary | ICD-10-CM | POA: Diagnosis not present

## 2022-09-28 DIAGNOSIS — Z96652 Presence of left artificial knee joint: Secondary | ICD-10-CM | POA: Diagnosis not present

## 2022-10-02 DIAGNOSIS — Z7409 Other reduced mobility: Secondary | ICD-10-CM | POA: Diagnosis not present

## 2022-10-02 DIAGNOSIS — M25662 Stiffness of left knee, not elsewhere classified: Secondary | ICD-10-CM | POA: Diagnosis not present

## 2022-10-02 DIAGNOSIS — M25462 Effusion, left knee: Secondary | ICD-10-CM | POA: Diagnosis not present

## 2022-10-02 DIAGNOSIS — Z96652 Presence of left artificial knee joint: Secondary | ICD-10-CM | POA: Diagnosis not present

## 2022-10-02 DIAGNOSIS — G8929 Other chronic pain: Secondary | ICD-10-CM | POA: Diagnosis not present

## 2022-10-02 DIAGNOSIS — M25562 Pain in left knee: Secondary | ICD-10-CM | POA: Diagnosis not present

## 2022-10-02 DIAGNOSIS — R29898 Other symptoms and signs involving the musculoskeletal system: Secondary | ICD-10-CM | POA: Diagnosis not present

## 2022-10-05 DIAGNOSIS — Z96652 Presence of left artificial knee joint: Secondary | ICD-10-CM | POA: Diagnosis not present

## 2022-10-05 DIAGNOSIS — M25462 Effusion, left knee: Secondary | ICD-10-CM | POA: Diagnosis not present

## 2022-10-05 DIAGNOSIS — G8929 Other chronic pain: Secondary | ICD-10-CM | POA: Diagnosis not present

## 2022-10-05 DIAGNOSIS — Z7409 Other reduced mobility: Secondary | ICD-10-CM | POA: Diagnosis not present

## 2022-10-05 DIAGNOSIS — M25562 Pain in left knee: Secondary | ICD-10-CM | POA: Diagnosis not present

## 2022-10-05 DIAGNOSIS — M25662 Stiffness of left knee, not elsewhere classified: Secondary | ICD-10-CM | POA: Diagnosis not present

## 2022-10-05 DIAGNOSIS — R29898 Other symptoms and signs involving the musculoskeletal system: Secondary | ICD-10-CM | POA: Diagnosis not present

## 2022-10-09 DIAGNOSIS — M25462 Effusion, left knee: Secondary | ICD-10-CM | POA: Diagnosis not present

## 2022-10-09 DIAGNOSIS — M25562 Pain in left knee: Secondary | ICD-10-CM | POA: Diagnosis not present

## 2022-10-09 DIAGNOSIS — Z96652 Presence of left artificial knee joint: Secondary | ICD-10-CM | POA: Diagnosis not present

## 2022-10-09 DIAGNOSIS — Z7409 Other reduced mobility: Secondary | ICD-10-CM | POA: Diagnosis not present

## 2022-10-09 DIAGNOSIS — M25662 Stiffness of left knee, not elsewhere classified: Secondary | ICD-10-CM | POA: Diagnosis not present

## 2022-10-09 DIAGNOSIS — G8929 Other chronic pain: Secondary | ICD-10-CM | POA: Diagnosis not present

## 2022-10-09 DIAGNOSIS — R29898 Other symptoms and signs involving the musculoskeletal system: Secondary | ICD-10-CM | POA: Diagnosis not present

## 2022-10-12 DIAGNOSIS — Z96652 Presence of left artificial knee joint: Secondary | ICD-10-CM | POA: Diagnosis not present

## 2022-10-12 DIAGNOSIS — R29898 Other symptoms and signs involving the musculoskeletal system: Secondary | ICD-10-CM | POA: Diagnosis not present

## 2022-10-12 DIAGNOSIS — Z7409 Other reduced mobility: Secondary | ICD-10-CM | POA: Diagnosis not present

## 2022-10-12 DIAGNOSIS — M25662 Stiffness of left knee, not elsewhere classified: Secondary | ICD-10-CM | POA: Diagnosis not present

## 2022-10-12 DIAGNOSIS — G8929 Other chronic pain: Secondary | ICD-10-CM | POA: Diagnosis not present

## 2022-10-12 DIAGNOSIS — M25462 Effusion, left knee: Secondary | ICD-10-CM | POA: Diagnosis not present

## 2022-10-12 DIAGNOSIS — M25562 Pain in left knee: Secondary | ICD-10-CM | POA: Diagnosis not present

## 2022-10-16 DIAGNOSIS — Z96652 Presence of left artificial knee joint: Secondary | ICD-10-CM | POA: Diagnosis not present

## 2022-10-16 DIAGNOSIS — M25662 Stiffness of left knee, not elsewhere classified: Secondary | ICD-10-CM | POA: Diagnosis not present

## 2022-10-16 DIAGNOSIS — Z7409 Other reduced mobility: Secondary | ICD-10-CM | POA: Diagnosis not present

## 2022-10-16 DIAGNOSIS — R29898 Other symptoms and signs involving the musculoskeletal system: Secondary | ICD-10-CM | POA: Diagnosis not present

## 2022-10-16 DIAGNOSIS — M25462 Effusion, left knee: Secondary | ICD-10-CM | POA: Diagnosis not present

## 2022-10-16 DIAGNOSIS — M25562 Pain in left knee: Secondary | ICD-10-CM | POA: Diagnosis not present

## 2022-10-16 DIAGNOSIS — G8929 Other chronic pain: Secondary | ICD-10-CM | POA: Diagnosis not present

## 2022-10-19 DIAGNOSIS — Z96652 Presence of left artificial knee joint: Secondary | ICD-10-CM | POA: Diagnosis not present

## 2022-10-19 DIAGNOSIS — M25562 Pain in left knee: Secondary | ICD-10-CM | POA: Diagnosis not present

## 2022-10-19 DIAGNOSIS — M25662 Stiffness of left knee, not elsewhere classified: Secondary | ICD-10-CM | POA: Diagnosis not present

## 2022-10-19 DIAGNOSIS — R29898 Other symptoms and signs involving the musculoskeletal system: Secondary | ICD-10-CM | POA: Diagnosis not present

## 2022-10-19 DIAGNOSIS — Z7409 Other reduced mobility: Secondary | ICD-10-CM | POA: Diagnosis not present

## 2022-10-19 DIAGNOSIS — M25462 Effusion, left knee: Secondary | ICD-10-CM | POA: Diagnosis not present

## 2022-10-19 DIAGNOSIS — G8929 Other chronic pain: Secondary | ICD-10-CM | POA: Diagnosis not present

## 2022-10-23 DIAGNOSIS — Z96652 Presence of left artificial knee joint: Secondary | ICD-10-CM | POA: Diagnosis not present

## 2022-10-23 DIAGNOSIS — M25562 Pain in left knee: Secondary | ICD-10-CM | POA: Diagnosis not present

## 2022-10-23 DIAGNOSIS — Z7409 Other reduced mobility: Secondary | ICD-10-CM | POA: Diagnosis not present

## 2022-10-23 DIAGNOSIS — R29898 Other symptoms and signs involving the musculoskeletal system: Secondary | ICD-10-CM | POA: Diagnosis not present

## 2022-10-23 DIAGNOSIS — M25662 Stiffness of left knee, not elsewhere classified: Secondary | ICD-10-CM | POA: Diagnosis not present

## 2022-10-23 DIAGNOSIS — M25462 Effusion, left knee: Secondary | ICD-10-CM | POA: Diagnosis not present

## 2022-10-26 DIAGNOSIS — Z96652 Presence of left artificial knee joint: Secondary | ICD-10-CM | POA: Diagnosis not present

## 2022-10-26 DIAGNOSIS — M25462 Effusion, left knee: Secondary | ICD-10-CM | POA: Diagnosis not present

## 2022-10-26 DIAGNOSIS — M25662 Stiffness of left knee, not elsewhere classified: Secondary | ICD-10-CM | POA: Diagnosis not present

## 2022-10-26 DIAGNOSIS — Z7409 Other reduced mobility: Secondary | ICD-10-CM | POA: Diagnosis not present

## 2022-10-26 DIAGNOSIS — M25562 Pain in left knee: Secondary | ICD-10-CM | POA: Diagnosis not present

## 2022-10-26 DIAGNOSIS — R29898 Other symptoms and signs involving the musculoskeletal system: Secondary | ICD-10-CM | POA: Diagnosis not present

## 2022-10-31 DIAGNOSIS — Z96652 Presence of left artificial knee joint: Secondary | ICD-10-CM | POA: Diagnosis not present

## 2022-11-02 DIAGNOSIS — M25562 Pain in left knee: Secondary | ICD-10-CM | POA: Diagnosis not present

## 2022-11-02 DIAGNOSIS — M25462 Effusion, left knee: Secondary | ICD-10-CM | POA: Diagnosis not present

## 2022-11-02 DIAGNOSIS — M25662 Stiffness of left knee, not elsewhere classified: Secondary | ICD-10-CM | POA: Diagnosis not present

## 2022-11-02 DIAGNOSIS — Z7409 Other reduced mobility: Secondary | ICD-10-CM | POA: Diagnosis not present

## 2022-11-02 DIAGNOSIS — Z96652 Presence of left artificial knee joint: Secondary | ICD-10-CM | POA: Diagnosis not present

## 2022-11-16 DIAGNOSIS — M25562 Pain in left knee: Secondary | ICD-10-CM | POA: Diagnosis not present

## 2022-11-16 DIAGNOSIS — M25662 Stiffness of left knee, not elsewhere classified: Secondary | ICD-10-CM | POA: Diagnosis not present

## 2022-11-16 DIAGNOSIS — R29898 Other symptoms and signs involving the musculoskeletal system: Secondary | ICD-10-CM | POA: Diagnosis not present

## 2022-11-16 DIAGNOSIS — Z96652 Presence of left artificial knee joint: Secondary | ICD-10-CM | POA: Diagnosis not present

## 2022-11-16 DIAGNOSIS — M25462 Effusion, left knee: Secondary | ICD-10-CM | POA: Diagnosis not present

## 2022-11-16 DIAGNOSIS — Z7409 Other reduced mobility: Secondary | ICD-10-CM | POA: Diagnosis not present

## 2023-01-02 DIAGNOSIS — Z96652 Presence of left artificial knee joint: Secondary | ICD-10-CM | POA: Diagnosis not present

## 2023-02-20 DIAGNOSIS — H43812 Vitreous degeneration, left eye: Secondary | ICD-10-CM | POA: Diagnosis not present

## 2023-02-20 DIAGNOSIS — H26492 Other secondary cataract, left eye: Secondary | ICD-10-CM | POA: Diagnosis not present

## 2023-02-20 DIAGNOSIS — H43392 Other vitreous opacities, left eye: Secondary | ICD-10-CM | POA: Diagnosis not present

## 2023-02-20 DIAGNOSIS — H35372 Puckering of macula, left eye: Secondary | ICD-10-CM | POA: Diagnosis not present

## 2023-02-20 DIAGNOSIS — H04123 Dry eye syndrome of bilateral lacrimal glands: Secondary | ICD-10-CM | POA: Diagnosis not present

## 2023-03-27 DIAGNOSIS — H524 Presbyopia: Secondary | ICD-10-CM | POA: Diagnosis not present

## 2023-03-27 DIAGNOSIS — H35373 Puckering of macula, bilateral: Secondary | ICD-10-CM | POA: Diagnosis not present

## 2023-03-29 DIAGNOSIS — Z85828 Personal history of other malignant neoplasm of skin: Secondary | ICD-10-CM | POA: Diagnosis not present

## 2023-03-29 DIAGNOSIS — I10 Essential (primary) hypertension: Secondary | ICD-10-CM | POA: Diagnosis not present

## 2023-03-29 DIAGNOSIS — Z809 Family history of malignant neoplasm, unspecified: Secondary | ICD-10-CM | POA: Diagnosis not present

## 2023-03-29 DIAGNOSIS — F419 Anxiety disorder, unspecified: Secondary | ICD-10-CM | POA: Diagnosis not present

## 2023-03-29 DIAGNOSIS — F325 Major depressive disorder, single episode, in full remission: Secondary | ICD-10-CM | POA: Diagnosis not present

## 2023-03-29 DIAGNOSIS — M1990 Unspecified osteoarthritis, unspecified site: Secondary | ICD-10-CM | POA: Diagnosis not present

## 2023-03-29 DIAGNOSIS — Z96659 Presence of unspecified artificial knee joint: Secondary | ICD-10-CM | POA: Diagnosis not present

## 2023-03-29 DIAGNOSIS — Z6838 Body mass index (BMI) 38.0-38.9, adult: Secondary | ICD-10-CM | POA: Diagnosis not present

## 2023-03-29 DIAGNOSIS — E785 Hyperlipidemia, unspecified: Secondary | ICD-10-CM | POA: Diagnosis not present

## 2023-04-12 DIAGNOSIS — R030 Elevated blood-pressure reading, without diagnosis of hypertension: Secondary | ICD-10-CM | POA: Diagnosis not present

## 2023-04-12 DIAGNOSIS — E785 Hyperlipidemia, unspecified: Secondary | ICD-10-CM | POA: Diagnosis not present

## 2023-04-12 DIAGNOSIS — M810 Age-related osteoporosis without current pathological fracture: Secondary | ICD-10-CM | POA: Diagnosis not present

## 2023-04-12 DIAGNOSIS — F419 Anxiety disorder, unspecified: Secondary | ICD-10-CM | POA: Diagnosis not present

## 2023-04-12 DIAGNOSIS — Z6841 Body Mass Index (BMI) 40.0 and over, adult: Secondary | ICD-10-CM | POA: Diagnosis not present

## 2023-04-12 DIAGNOSIS — H6123 Impacted cerumen, bilateral: Secondary | ICD-10-CM | POA: Diagnosis not present

## 2023-04-12 DIAGNOSIS — E2839 Other primary ovarian failure: Secondary | ICD-10-CM | POA: Diagnosis not present

## 2023-04-12 DIAGNOSIS — Z0000 Encounter for general adult medical examination without abnormal findings: Secondary | ICD-10-CM | POA: Diagnosis not present

## 2023-04-12 DIAGNOSIS — M67432 Ganglion, left wrist: Secondary | ICD-10-CM | POA: Diagnosis not present

## 2023-04-12 DIAGNOSIS — F5103 Paradoxical insomnia: Secondary | ICD-10-CM | POA: Diagnosis not present

## 2023-04-12 DIAGNOSIS — Z1211 Encounter for screening for malignant neoplasm of colon: Secondary | ICD-10-CM | POA: Diagnosis not present

## 2023-05-15 DIAGNOSIS — D235 Other benign neoplasm of skin of trunk: Secondary | ICD-10-CM | POA: Diagnosis not present

## 2023-05-15 DIAGNOSIS — L821 Other seborrheic keratosis: Secondary | ICD-10-CM | POA: Diagnosis not present

## 2023-05-15 DIAGNOSIS — L718 Other rosacea: Secondary | ICD-10-CM | POA: Diagnosis not present

## 2023-05-15 DIAGNOSIS — L570 Actinic keratosis: Secondary | ICD-10-CM | POA: Diagnosis not present

## 2023-05-17 DIAGNOSIS — U071 COVID-19: Secondary | ICD-10-CM | POA: Diagnosis not present

## 2023-05-17 DIAGNOSIS — J029 Acute pharyngitis, unspecified: Secondary | ICD-10-CM | POA: Diagnosis not present

## 2023-05-17 DIAGNOSIS — R52 Pain, unspecified: Secondary | ICD-10-CM | POA: Diagnosis not present

## 2023-05-17 DIAGNOSIS — R051 Acute cough: Secondary | ICD-10-CM | POA: Diagnosis not present

## 2023-09-04 DIAGNOSIS — Z1231 Encounter for screening mammogram for malignant neoplasm of breast: Secondary | ICD-10-CM | POA: Diagnosis not present

## 2023-09-28 DIAGNOSIS — H35373 Puckering of macula, bilateral: Secondary | ICD-10-CM | POA: Diagnosis not present

## 2023-10-03 DIAGNOSIS — Z96652 Presence of left artificial knee joint: Secondary | ICD-10-CM | POA: Diagnosis not present

## 2023-10-30 DIAGNOSIS — L299 Pruritus, unspecified: Secondary | ICD-10-CM | POA: Diagnosis not present

## 2023-10-30 DIAGNOSIS — H6123 Impacted cerumen, bilateral: Secondary | ICD-10-CM | POA: Diagnosis not present

## 2023-10-30 DIAGNOSIS — H900 Conductive hearing loss, bilateral: Secondary | ICD-10-CM | POA: Diagnosis not present

## 2023-11-05 DIAGNOSIS — I10 Essential (primary) hypertension: Secondary | ICD-10-CM | POA: Diagnosis not present

## 2023-11-05 DIAGNOSIS — F419 Anxiety disorder, unspecified: Secondary | ICD-10-CM | POA: Diagnosis not present

## 2023-11-05 DIAGNOSIS — Z6841 Body Mass Index (BMI) 40.0 and over, adult: Secondary | ICD-10-CM | POA: Diagnosis not present

## 2024-03-10 DIAGNOSIS — Z1331 Encounter for screening for depression: Secondary | ICD-10-CM | POA: Diagnosis not present

## 2024-03-10 DIAGNOSIS — M810 Age-related osteoporosis without current pathological fracture: Secondary | ICD-10-CM | POA: Diagnosis not present

## 2024-03-10 DIAGNOSIS — Z6841 Body Mass Index (BMI) 40.0 and over, adult: Secondary | ICD-10-CM | POA: Diagnosis not present

## 2024-03-10 DIAGNOSIS — I10 Essential (primary) hypertension: Secondary | ICD-10-CM | POA: Diagnosis not present

## 2024-03-10 DIAGNOSIS — E7800 Pure hypercholesterolemia, unspecified: Secondary | ICD-10-CM | POA: Diagnosis not present

## 2024-03-10 DIAGNOSIS — F419 Anxiety disorder, unspecified: Secondary | ICD-10-CM | POA: Diagnosis not present

## 2024-03-31 DIAGNOSIS — H35373 Puckering of macula, bilateral: Secondary | ICD-10-CM | POA: Diagnosis not present

## 2024-04-18 DIAGNOSIS — M810 Age-related osteoporosis without current pathological fracture: Secondary | ICD-10-CM | POA: Diagnosis not present

## 2024-04-18 DIAGNOSIS — I10 Essential (primary) hypertension: Secondary | ICD-10-CM | POA: Diagnosis not present

## 2024-04-18 DIAGNOSIS — Z1159 Encounter for screening for other viral diseases: Secondary | ICD-10-CM | POA: Diagnosis not present

## 2024-04-18 DIAGNOSIS — E7800 Pure hypercholesterolemia, unspecified: Secondary | ICD-10-CM | POA: Diagnosis not present

## 2024-04-18 DIAGNOSIS — Z1211 Encounter for screening for malignant neoplasm of colon: Secondary | ICD-10-CM | POA: Diagnosis not present

## 2024-05-12 DIAGNOSIS — M25561 Pain in right knee: Secondary | ICD-10-CM | POA: Diagnosis not present

## 2024-05-16 DIAGNOSIS — H18413 Arcus senilis, bilateral: Secondary | ICD-10-CM | POA: Diagnosis not present

## 2024-05-16 DIAGNOSIS — H35373 Puckering of macula, bilateral: Secondary | ICD-10-CM | POA: Diagnosis not present

## 2024-05-16 DIAGNOSIS — Z961 Presence of intraocular lens: Secondary | ICD-10-CM | POA: Diagnosis not present

## 2024-05-16 DIAGNOSIS — H26492 Other secondary cataract, left eye: Secondary | ICD-10-CM | POA: Diagnosis not present

## 2024-05-16 DIAGNOSIS — H26493 Other secondary cataract, bilateral: Secondary | ICD-10-CM | POA: Diagnosis not present

## 2024-05-23 DIAGNOSIS — Z961 Presence of intraocular lens: Secondary | ICD-10-CM | POA: Diagnosis not present

## 2024-06-24 DIAGNOSIS — H6123 Impacted cerumen, bilateral: Secondary | ICD-10-CM | POA: Diagnosis not present
